# Patient Record
Sex: Female | Born: 1985 | Race: Black or African American | Hispanic: No | Marital: Single | Smoking: Never smoker
Health system: Southern US, Community
[De-identification: ages and names within clinical notes are randomized; demographics above are authoritative.]

## PROBLEM LIST (undated history)

## (undated) DIAGNOSIS — Z3009 Encounter for other general counseling and advice on contraception: Secondary | ICD-10-CM

## (undated) HISTORY — DX: Encounter for other general counseling and advice on contraception: Z30.09

## (undated) HISTORY — PX: BACK SURGERY: SHX140

---
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
## 2005-07-06 ENCOUNTER — Ambulatory Visit: Payer: Self-pay | Admitting: Family Medicine

## 2006-05-25 ENCOUNTER — Inpatient Hospital Stay (HOSPITAL_COMMUNITY): Admission: AD | Admit: 2006-05-25 | Discharge: 2006-05-27 | Payer: Self-pay | Admitting: Obstetrics & Gynecology

## 2006-05-25 ENCOUNTER — Ambulatory Visit: Payer: Self-pay | Admitting: Obstetrics and Gynecology

## 2006-12-28 ENCOUNTER — Other Ambulatory Visit: Admission: RE | Admit: 2006-12-28 | Discharge: 2006-12-28 | Payer: Self-pay | Admitting: Unknown Physician Specialty

## 2006-12-28 ENCOUNTER — Encounter (INDEPENDENT_AMBULATORY_CARE_PROVIDER_SITE_OTHER): Payer: Self-pay | Admitting: Unknown Physician Specialty

## 2008-01-19 ENCOUNTER — Ambulatory Visit: Payer: Self-pay | Admitting: Family Medicine

## 2008-01-23 ENCOUNTER — Encounter: Payer: Self-pay | Admitting: Family Medicine

## 2008-02-07 ENCOUNTER — Encounter: Payer: Self-pay | Admitting: Family Medicine

## 2008-04-19 ENCOUNTER — Encounter: Payer: Self-pay | Admitting: Family Medicine

## 2008-04-20 ENCOUNTER — Ambulatory Visit: Payer: Self-pay | Admitting: Family Medicine

## 2008-04-23 ENCOUNTER — Ambulatory Visit: Payer: Self-pay | Admitting: Family Medicine

## 2008-04-25 ENCOUNTER — Encounter: Payer: Self-pay | Admitting: Family Medicine

## 2008-04-25 LAB — CONVERTED CEMR LAB
Candida species: NEGATIVE
Chlamydia, DNA Probe: NEGATIVE
GC Probe Amp, Genital: NEGATIVE

## 2008-07-12 ENCOUNTER — Ambulatory Visit: Payer: Self-pay | Admitting: Family Medicine

## 2008-08-23 ENCOUNTER — Encounter: Payer: Self-pay | Admitting: Family Medicine

## 2008-08-23 ENCOUNTER — Ambulatory Visit: Payer: Self-pay | Admitting: Family Medicine

## 2008-08-23 ENCOUNTER — Other Ambulatory Visit: Admission: RE | Admit: 2008-08-23 | Discharge: 2008-08-23 | Payer: Self-pay | Admitting: Family Medicine

## 2008-08-24 ENCOUNTER — Encounter: Payer: Self-pay | Admitting: Family Medicine

## 2008-08-24 LAB — CONVERTED CEMR LAB
GC Probe Amp, Genital: NEGATIVE
Trichomonal Vaginitis: NEGATIVE

## 2008-09-06 ENCOUNTER — Other Ambulatory Visit: Admission: RE | Admit: 2008-09-06 | Discharge: 2008-09-06 | Payer: Self-pay | Admitting: Family Medicine

## 2008-09-06 ENCOUNTER — Ambulatory Visit: Payer: Self-pay | Admitting: Family Medicine

## 2008-09-06 ENCOUNTER — Encounter: Payer: Self-pay | Admitting: Family Medicine

## 2008-10-04 ENCOUNTER — Ambulatory Visit: Payer: Self-pay | Admitting: Family Medicine

## 2008-10-08 ENCOUNTER — Encounter: Payer: Self-pay | Admitting: Family Medicine

## 2008-10-08 LAB — CONVERTED CEMR LAB
BUN: 7 mg/dL (ref 6–23)
Chloride: 108 meq/L (ref 96–112)
Eosinophils Relative: 5 % (ref 0–5)
HCT: 35.2 % — ABNORMAL LOW (ref 36.0–46.0)
HDL: 49 mg/dL (ref >39)
Hemoglobin: 11.1 g/dL — ABNORMAL LOW (ref 12.0–15.0)
LDL Cholesterol: 61 mg/dL (ref 0–99)
Lymphocytes Relative: 37 % (ref 12–46)
Lymphs Abs: 2.2 10*3/uL (ref 0.7–4.0)
Monocytes Relative: 13 % — ABNORMAL HIGH (ref 3–12)
Platelets: 242 10*3/uL (ref 150–400)
Potassium: 4.6 meq/L (ref 3.5–5.3)
RBC: 4.95 M/uL (ref 3.87–5.11)
Triglycerides: 36 mg/dL (ref <150)
VLDL: 7 mg/dL (ref 0–40)
WBC: 5.8 10*3/uL (ref 4.0–10.5)

## 2008-10-11 ENCOUNTER — Encounter: Payer: Self-pay | Admitting: Family Medicine

## 2008-12-27 ENCOUNTER — Ambulatory Visit: Payer: Self-pay | Admitting: Family Medicine

## 2009-03-17 ENCOUNTER — Emergency Department (HOSPITAL_COMMUNITY): Admission: EM | Admit: 2009-03-17 | Discharge: 2009-03-17 | Payer: Self-pay | Admitting: Emergency Medicine

## 2009-03-21 ENCOUNTER — Ambulatory Visit: Payer: Self-pay | Admitting: Family Medicine

## 2009-05-30 ENCOUNTER — Ambulatory Visit: Payer: Self-pay | Admitting: Family Medicine

## 2009-05-30 LAB — CONVERTED CEMR LAB
Glucose, Urine, Semiquant: NEGATIVE
Protein, U semiquant: NEGATIVE
Urobilinogen, UA: 0.2

## 2009-06-01 ENCOUNTER — Encounter: Payer: Self-pay | Admitting: Physician Assistant

## 2009-06-03 ENCOUNTER — Telehealth: Payer: Self-pay | Admitting: Family Medicine

## 2009-10-17 ENCOUNTER — Ambulatory Visit: Payer: Self-pay | Admitting: Family Medicine

## 2009-10-17 ENCOUNTER — Other Ambulatory Visit: Admission: RE | Admit: 2009-10-17 | Discharge: 2009-10-17 | Payer: Self-pay | Admitting: Family Medicine

## 2009-10-17 LAB — CONVERTED CEMR LAB
Blood in Urine, dipstick: NEGATIVE
Chlamydia, DNA Probe: NEGATIVE
Ketones, urine, test strip: NEGATIVE
Nitrite: NEGATIVE
Urobilinogen, UA: 0.2

## 2009-10-18 ENCOUNTER — Encounter: Payer: Self-pay | Admitting: Family Medicine

## 2009-10-18 ENCOUNTER — Telehealth: Payer: Self-pay | Admitting: Family Medicine

## 2010-01-09 ENCOUNTER — Ambulatory Visit: Payer: Self-pay | Admitting: Family Medicine

## 2010-04-03 ENCOUNTER — Ambulatory Visit
Admission: RE | Admit: 2010-04-03 | Discharge: 2010-04-03 | Payer: Self-pay | Source: Home / Self Care | Attending: Family Medicine | Admitting: Family Medicine

## 2010-04-10 NOTE — Assessment & Plan Note (Signed)
Summary: depo  Nurse Visit   Vital Signs:  Patient profile:   25 year old female Weight:      132.75 pounds BP sitting:   120 / 60  (left arm)  Vitals Entered By: Adella Hare LPN (January 09, 2010 11:26 AM)  Allergies: No Known Drug Allergies  Medication Administration  Injection # 1:    Medication: Depo-Provera 150mg     Diagnosis: CONTRACEPTIVE MANAGEMENT (ICD-V25.09)    Route: IM    Site: L deltoid    Exp Date: 06/14    Lot #: Sanda Linger    Mfr: [email or web address]    Patient tolerated injection without complications    Given by: Adella Hare LPN (January 09, 2010 11:27 AM)  Orders Added: 1)  Depo-Provera 150mg  [J1055] 2)  Admin of Therapeutic Inj  intramuscular or subcutaneous [96372]   Medication Administration  Injection # 1:    Medication: Depo-Provera 150mg     Diagnosis: CONTRACEPTIVE MANAGEMENT (ICD-V25.09)    Route: IM    Site: L deltoid    Exp Date: 06/14    Lot #: Sanda Linger    Mfr: [email or web address]    Patient tolerated injection without complications    Given by: Adella Hare LPN (January 09, 2010 11:27 AM)  Orders Added: 1)  Depo-Provera 150mg  [J1055] 2)  Admin of Therapeutic Inj  intramuscular or subcutaneous [96372] injections given with no complications

## 2010-04-10 NOTE — Assessment & Plan Note (Signed)
Summary: depo/slj  Nurse Visit   Vital Signs:  Patient profile:   25 year old female Weight:      132.25 pounds BP sitting:   124 / 64  (left arm)  Allergies: No Known Drug Allergies  Medication Administration  Injection # 1:    Medication: Depo-Provera 150mg     Diagnosis: CONTRACEPTIVE MANAGEMENT (ICD-V25.09)    Route: IM    Site: L deltoid    Exp Date: 01/14    Lot #: Haywood Pao    Mfr: [email or web address]    Patient tolerated injection without complications    Given by: Adella Hare LPN (April 03, 2010 10:16 AM)  Orders Added: 1)  Depo-Provera 150mg  [J1055] 2)  Admin of Therapeutic Inj  intramuscular or subcutaneous [96372]   Medication Administration  Injection # 1:    Medication: Depo-Provera 150mg     Diagnosis: CONTRACEPTIVE MANAGEMENT (ICD-V25.09)    Route: IM    Site: L deltoid    Exp Date: 01/14    Lot #: Haywood Pao    Mfr: [email or web address]    Patient tolerated injection without complications    Given by: Adella Hare LPN (April 03, 2010 10:16 AM)  Orders Added: 1)  Depo-Provera 150mg  [J1055] 2)  Admin of Therapeutic Inj  intramuscular or subcutaneous [16109]

## 2010-04-10 NOTE — Assessment & Plan Note (Signed)
Summary: INJ  Nurse Visit   Vital Signs:  Patient profile:   25 year old female Height:      65 inches Weight:      130 pounds Pulse rate:   106 / minute BP sitting:   110 / 70  (left arm)  Vitals Entered By: Worthy Keeler LPN (March 21, 2009 9:57 AM) Comments patient in for depo provera inj.   Allergies: No Known Drug Allergies  Medication Administration  Injection # 1:    Medication: Depo-Provera 150mg     Diagnosis: CONTRACEPTIVE MANAGEMENT (ICD-V25.09)    Route: IM    Site: L deltoid    Exp Date: 8/13    Lot #: [id number]    Mfr: greenstone ltd    Patient tolerated injection without complications    Given by: Worthy Keeler LPN (March 21, 2009 9:58 AM)  Orders Added: 1)  Admin of Therapeutic Inj  intramuscular or subcutaneous [96372]   Medication Administration  Injection # 1:    Medication: Depo-Provera 150mg     Diagnosis: CONTRACEPTIVE MANAGEMENT (ICD-V25.09)    Route: IM    Site: L deltoid    Exp Date: 8/13    Lot #: [id number]    Mfr: greenstone ltd    Patient tolerated injection without complications    Given by: Worthy Keeler LPN (March 21, 2009 9:58 AM)  Orders Added: 1)  Admin of Therapeutic Inj  intramuscular or subcutaneous [[id number]]

## 2010-04-10 NOTE — Progress Notes (Signed)
Summary: please advise  Phone Note Call from Patient   Summary of Call: [phone] patient would like for you to call her back, she asked to speak to the doctor she wouldn't tell me what it was about or anything.   Initial call taken by: Curtis Sites,  June 03, 2009 2:00 PM  Follow-up for Phone Call        Patient had been hurting yesterday but only when she wiped. She states shes fine now. No problems with anything. Advised her if she started hurting again to call us back  Follow-up by: Everitt Amber LPN,  June 04, 2009 3:04 PM

## 2010-04-10 NOTE — Assessment & Plan Note (Signed)
Summary: ? UTI - room 2   Vital Signs:  Patient profile:   25 year old female Height:      65 inches Weight:      131.50 pounds BMI:     21.96 O2 Sat:      100 % on Room air Pulse rate:   82 / minute Resp:     16 per minute BP sitting:   132 / 76  (left arm)  Vitals Entered By: Adella Hare LPN (May 30, 2009 3:14 PM) CC: burning with urination Is Patient Diabetic? No Pain Assessment Patient in pain? no        CC:  burning with urination.  History of Present Illness: Pt here today with c/o dysuria x 4 days.   Carnberry juice some help. + freq + abd pain, + nausea no back pain  Also interested in changing birth control.  Has been on Depo provera x 3 ys.   Used OCP's in past & no probs with.  Current Medications (verified): 1)  None  Allergies (verified): No Known Drug Allergies  Past History:  Past medical history reviewed for relevance to current acute and chronic problems.  Past Medical History: Reviewed history from 01/23/2008 and no changes required. 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 CV:  Denies chest pain or discomfort. Resp:  Denies shortness of breath. GI:  Complains of abdominal pain and nausea; denies vomiting. GU:  Denies dysuria and urinary frequency. MS:  Denies low back pain.  Physical Exam  General:  Well-developed,well-nourished,in no acute distress; alert,appropriate and cooperative throughout examination Head:  Normocephalic and atraumatic without obvious abnormalities. No apparent alopecia or balding. Neck:  No deformities, masses, or tenderness noted. Lungs:  Normal respiratory effort, chest expands symmetrically. Lungs are clear to auscultation, no crackles or wheezes. Heart:  Normal rate and regular rhythm. S1 and S2 normal without gallop, murmur, click, rub or other extra sounds. Abdomen:   Bowel sounds positive,abdomen soft and non-tender without masses, organomegaly or hernias noted. Msk:  Neg [employer] Psych:  Cognition and judgment appear intact. Alert and cooperative with normal attention span and concentration. No apparent delusions, illusions, hallucinations   Impression & Recommendations:  Problem # 1:  UTI (ICD-599.0) Assessment New  The following medications were removed from the medication list:    Flagyl 500 Mg Tabs (Metronidazole) .Marland Kitchen... Take 1 tablet by mouth two times a day Her updated medication list for this problem includes:    Bactrim Ds 800-160 Mg Tabs (Sulfamethoxazole-trimethoprim) .Marland Kitchen... Take 1 two times a day x 3 days  Problem # 2:  CONTRACEPTIVE MANAGEMENT (ICD-V25.09) Pt will start Ortho Tricyclen Lo Sun Apr 2.  This is near the time she is due for her next Depo Provera injection.  Complete Medication List: 1)  Bactrim Ds 800-160 Mg Tabs (Sulfamethoxazole-trimethoprim) .... Take 1 two times a day x 3 days 2)  Ortho Tri-cyclen Lo 0.18/0.215/0.25 Mg-25 Mcg Tabs (Norgestim-eth estrad triphasic) .... Take 1 daily  Other Orders: UA Dipstick W/ Micro (manual) (19147) T-Culture, Urine ([id number])  Patient Instructions: 1)  Physical/Pap due Aug 2011. 2)  Increase fluids 3)  I have prescribed an antibiotic for your urinary infection. 4)  Start your birth control pills on Sunday April 2.  Take it about the same time every day. Prescriptions: ORTHO TRI-CYCLEN LO 0.18/0.215/0.25 MG-25 MCG TABS (NORGESTIM-ETH ESTRAD TRIPHASIC) take 1 daily  #1 x 5  Entered and Authorized by:   Esperanza Sheets PA   Signed by:   Esperanza Sheets PA on 05/30/2009   Method used:   Electronically to        [facility] [address] [phone]* (retail)       [address] [place], [place]  [address]       Ph: [phone]       Fax: [phone]   RxID:   [id number] BACTRIM DS 800-160 MG TABS (SULFAMETHOXAZOLE-TRIMETHOPRIM) take 1 two times a day x 3 days  #6 x 0   Entered and  Authorized by:   Esperanza Sheets PA   Signed by:   Esperanza Sheets PA on 05/30/2009   Method used:   Electronically to        [facility] [address] [phone]* (retail)       [address] [place], [place]  [address]       Ph: [phone]       Fax: [phone]   RxID:   [id number]   Laboratory Results   Urine Tests  Date/Time Received: May 30, 2009  Date/Time Reported: May 30, 2009   Routine Urinalysis   Color: yellow Appearance: Clear Glucose: negative   (Normal Range: Negative) Bilirubin: negative   (Normal Range: Negative) Ketone: negative   (Normal Range: Negative) Spec. Gravity: 1.015   (Normal Range: 1.003-1.035) Blood: trace-lysed   (Normal Range: Negative) pH: 7.5   (Normal Range: 5.0-8.0) Protein: negative   (Normal Range: Negative) Urobilinogen: 0.2   (Normal Range: 0-1) Nitrite: negative   (Normal Range: Negative) Leukocyte Esterace: small   (Normal Range: Negative)

## 2010-04-10 NOTE — Progress Notes (Signed)
Summary: CALL  Phone Note Call from Patient   Summary of Call: CALL HER AT [phone] Initial call taken by: Lind Guest,  October 18, 2009 8:28 AM  Follow-up for Phone Call        Already spoke to patient. See previous message  Follow-up by: Everitt Amber LPN,  October 18, 2009 11:13 AM

## 2010-04-10 NOTE — Assessment & Plan Note (Signed)
Summary: physical   Vital Signs:  Patient profile:   25 year old female Height:      65 inches Weight:      133.25 pounds BMI:     22.25 O2 Sat:      100 % on Room air Pulse rate:   89 / minute Pulse rhythm:   regular Resp:     16 per minute BP sitting:   100 / 70  (left arm)  Vitals Entered By: Adella Hare LPN (October 17, 2009 10:33 AM)  O2 Flow:  Room air CC: physical Is Patient Diabetic? No Pain Assessment Patient in pain? no       Vision Screening:Left eye w/o correction: 20 / 20 Right Eye w/o correction: 20 / 20 Both eyes w/o correction:  20/ 20  Color vision testing: normal      Vision Entered By: Adella Hare LPN (October 17, 2009 10:41 AM)   CC:  physical.  History of Present Illness: Reports  that she has been  doing well. Denies recent fever or chills. Denies sinus pressure, nasal congestion , ear pain or sore throat. Denies chest congestion, or cough productive of sputum. Denies chest pain, palpitations, PND, orthopnea or leg swelling. Denies abdominal pain, nausea, vomitting, diarrhea or constipation. Denies change in bowel movements or bloody stool. Denies dysuria , frequency, incontinence or hesitancy. Denies  joint pain, swelling, or reduced mobility. Denies headaches, vertigo, seizures. Denies depression, anxiety or insomnia. Denies  rash, lesions, or itch.     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 Eyes:  Denies blurring and double  vision. Endo:  Denies excessive thirst and excessive urination. Heme:  Denies abnormal bruising and bleeding. Allergy:  Denies hives or rash and itching eyes.  Physical Exam  General:  Well-developed,well-nourished,in no acute distress; alert,appropriate and cooperative throughout examination Head:  Normocephalic and atraumatic without obvious abnormalities. No apparent alopecia or balding. Eyes:  No corneal or conjunctival inflammation noted. EOMI. Perrla. Funduscopic exam benign, without hemorrhages, exudates or papilledema. Vision grossly normal. Ears:  External ear exam shows no significant lesions or deformities.  Otoscopic examination reveals clear canals, tympanic membranes are intact bilaterally without bulging, retraction, inflammation or discharge. Hearing is grossly normal bilaterally. Nose:  External nasal examination shows no deformity or inflammation. Nasal mucosa are pink and moist without lesions or exudates. Mouth:  Oral mucosa and oropharynx without lesions or exudates.  Teeth in good repair. Neck:  No deformities, masses, or tenderness noted. Chest Wall:  No deformities, masses, or tenderness noted. Breasts:  No mass, nodules, thickening, tenderness, bulging, retraction, inflamation, nipple discharge or skin changes noted.   Lungs:  Normal respiratory effort, chest expands symmetrically. Lungs are clear to auscultation, no crackles or wheezes. Heart:  Normal rate and regular rhythm. S1 and S2 normal without gallop, murmur, click, rub or other extra sounds. Abdomen:  Bowel sounds positive,abdomen soft and non-tender without masses, organomegaly or hernias noted. Genitalia:  Normal introitus for age, no external lesions,white vaginal discharge, mucosa pink and moist, no vaginal or cervical lesions, no  vaginal atrophy, no friaility or hemorrhage, normal uterus size and position, no adnexal masses or tendernessos stenotic , pt has had procedure for abn pap Msk:  No deformity or  scoliosis noted of thoracic or lumbar spine.   Pulses:  R and L carotid,radial,femoral,dorsalis pedis and posterior tibial pulses are full and equal bilaterally Extremities:  No clubbing, cyanosis, edema, or deformity noted with normal full range of motion of all joints.   Neurologic:  No cranial nerve deficits noted. Station and gait are normal. Plantar reflexes are down-going bilaterally. DTRs are symmetrical throughout. Sensory, motor and coordinative functions appear intact. Skin:  Intact without suspicious lesions or rashes Cervical Nodes:  No lymphadenopathy noted Axillary Nodes:  No palpable lymphadenopathy Inguinal Nodes:  No significant adenopathy Psych:  Cognition and judgment appear intact. Alert and cooperative with normal attention span and concentration. No apparent delusions, illusions, hallucinations   Complete Medication List: 1)  Metronidazole 500 Mg Tabs (Metronidazole) .... Take 1 tablet by mouth two times a day  Other Orders: Urine Pregnancy Test  (45409) Urinalysis (81191-47829) T-Culture, Urine ([id number]) T-CBC w/Diff (84696-29528) T-Anemia Panel 3  (2904) Pap Smear (41324) T-Wet Prep by Molecular Probe [id number]) T-Chlamydia & GC Probe, Genital ([id number]) Depo-Provera 150mg  (J1055) Admin of Therapeutic Inj  intramuscular or subcutaneous (64403)  Patient Instructions: 1)  F/u in 12 weeks exact for depo. 2)  It is important that you exercise regularly at least 20 minutes 5 times a week. If you develop chest pain, have severe difficulty breathing, or feel very tired , stop exercising immediately and seek medical attention. 3)  If you could be exposed to sexually transmitted diseases, you should use a condom. 4)  If you are having sex and you or your partner don't want a child, use contraception. 5)  CBC w/ Diff prior to visit, ICD-9: and anemia panel. 6)  You need to take one multivitamin daily, 7)  You als need to take calcium with vit D  1200mg /1000IU one daily 8)  We will cakllou next week with results Prescriptions: METRONIDAZOLE 500 MG TABS (METRONIDAZOLE) Take 1 tablet by mouth two times a day  #14 x 0   Entered and Authorized by:   Syliva Overman MD   Signed by:   Syliva Overman MD on 10/18/2009   Method used:   Historical   RxID:   [id number]   Laboratory Results   Urine Tests  Date/Time Received: October 17, 2009 10:34 AM  Date/Time Reported: October 17, 2009 10:34 AM   Routine Urinalysis   Color: yellow Appearance: Clear Glucose: negative   (Normal Range: Negative) Bilirubin: negative   (Normal Range: Negative) Ketone: negative   (Normal Range: Negative) Spec. Gravity: 1.025   (Normal Range: 1.003-1.035) Blood: negative   (Normal Range: Negative) pH: 7.0   (Normal Range: 5.0-8.0) Protein: negative   (Normal Range: Negative) Urobilinogen: 0.2   (Normal Range: 0-1) Nitrite: negative   (Normal Range: Negative) Leukocyte Esterace: trace   (Normal Range: Negative)    Urine HCG: negative       Medication Administration  Injection # 1:    Medication: Depo-Provera 150mg     Diagnosis: CONTRACEPTIVE MANAGEMENT (ICD-V25.09)    Route: IM    Site: R deltoid    Exp Date: 1/14    Lot #: Haywood Pao    Mfr: greenstone    Patient tolerated injection without complications    Given by: Adella Hare LPN (October 17, 2009 11:15 AM)  Orders Added: 1)  Urine Pregnancy Test  [81025]  2)  Urinalysis [81003-65000] 3)  T-Culture, Urine [[id number]] 4)  Est. Patient 18-39 years [99395] 5)  T-CBC w/Diff [82956-21308] 6)  T-Anemia Panel 3  [2904] 7)  Pap Smear [88150] 8)  T-Wet Prep by Molecular Probe [[id number]] 9)  T-Chlamydia & GC Probe, Genital [[id number]] 10)  Depo-Provera 150mg  [J1055] 11)  Admin of Therapeutic Inj  intramuscular or subcutaneous [52841]

## 2010-06-26 ENCOUNTER — Encounter: Payer: Self-pay | Admitting: Family Medicine

## 2010-06-26 ENCOUNTER — Ambulatory Visit (INDEPENDENT_AMBULATORY_CARE_PROVIDER_SITE_OTHER): Payer: PRIVATE HEALTH INSURANCE | Admitting: Family Medicine

## 2010-06-26 VITALS — BP 110/70 | Wt 131.4 lb

## 2010-06-26 DIAGNOSIS — Z309 Encounter for contraceptive management, unspecified: Secondary | ICD-10-CM

## 2010-06-26 MED ORDER — MEDROXYPROGESTERONE ACETATE 150 MG/ML IM SUSP
150.0000 mg | Freq: Once | INTRAMUSCULAR | Status: AC
  Administered 2010-06-26: 150 mg via INTRAMUSCULAR

## 2010-06-26 NOTE — Progress Notes (Signed)
Depo provera 150mg  given in left deltoid. Told to come back 1st week in July

## 2010-07-25 NOTE — H&P (Signed)
NAMEWAUNITA, SANDSTROM NO.:  [id number]   MEDICAL RECORD NO.:  [id number]          PATIENT TYPE:  INP   LOCATION:  9112                          FACILITY:  [facility]   PHYSICIAN:  Tilda Burrow, M.D. DATE OF BIRTH:  07-Jan-1986   DATE OF ADMISSION:  05/25/2006  DATE OF DISCHARGE:  LH                              HISTORY & PHYSICAL   ADMISSION DIAGNOSES:  1. Pregnancy at 39 weeks.  2. Advanced cervical favorability.  3. Elective induction of labor.   HISTORY OF PRESENT ILLNESS:  This 25 year old, primiparous female who  lives in [place], [place] August 27, 2005, placing [facility] June 03, 2006 with  two corresponding ultrasounds is admitted after prenatal course was  followed at [facility] through 12 prenatal visits with an  uncomplicated prenatal course.  She is admitted for induction of labor  with advanced cervical favorability.   PAST MEDICAL HISTORY:  Uneventful history other than scoliosis for which  she had Harrington rods placed in the back in 2005 at [facility].  Results are unavailable but the mid line incision  runs down to L2-3, so plans are to avoid consideration of epidural.   ALLERGIES:  Negative.   SOCIAL HISTORY:  Single. Lives with mom.  Works at [employer].  Partner  is _____ [facility] who is supportive.  This is his second child.  She plans  IV analgesic and take baby to Dr. Milford Cage.  She plans __________ future  contraception and will bottle feed.   PRENATAL LABS:  Blood type O positive, rubella immune.  Urine drug  screen negative.  Hemoglobin 11, hematocrit 34. Hepatitis, HIV, RPR, GC  and Chlamydia all negative.  GBS negative.  Glucose tolerance test 97  mg/%.   PHYSICAL EXAMINATION:  GENERAL:  [place], [place] American female.  VITAL SIGNS:  Height 5 feet 5 inches, weight 156.  Blood pressure  122/68.  GENERAL:  Alert and oriented x3.  HEENT:  Pupils are equal, round and reactive.  NECK:  Supple. Trachea midline.  ABDOMEN:  35 to 36 cm fundal height.  Vertex presentation.  PELVIC:  Cervix 3, 75%, minus 1, position cervix vertex.   PLAN:  Pitocin induction on May 27, 2006.      Tilda Burrow, M.D.  Electronically Signed     JVF/MEDQ  D:  05/25/2006  T:  05/26/2006  Job:  [id number]   cc:   Francoise Schaumann. Milford Cage DO, FAAP  Fax: [phone]

## 2010-09-09 ENCOUNTER — Ambulatory Visit (INDEPENDENT_AMBULATORY_CARE_PROVIDER_SITE_OTHER): Payer: PRIVATE HEALTH INSURANCE | Admitting: Family Medicine

## 2010-09-09 VITALS — BP 110/70 | Wt 133.1 lb

## 2010-09-09 DIAGNOSIS — Z309 Encounter for contraceptive management, unspecified: Secondary | ICD-10-CM

## 2010-09-09 MED ORDER — MEDROXYPROGESTERONE ACETATE 150 MG/ML IM SUSP
150.0000 mg | Freq: Once | INTRAMUSCULAR | Status: AC
  Administered 2010-09-09: 150 mg via INTRAMUSCULAR

## 2010-11-18 ENCOUNTER — Encounter: Payer: PRIVATE HEALTH INSURANCE | Admitting: Family Medicine

## 2010-12-01 ENCOUNTER — Encounter: Payer: Self-pay | Admitting: Family Medicine

## 2010-12-02 ENCOUNTER — Other Ambulatory Visit (HOSPITAL_COMMUNITY)
Admission: RE | Admit: 2010-12-02 | Discharge: 2010-12-02 | Disposition: A | Discharge status: Home / Self Care | Payer: 59 | Source: Ambulatory Visit | Attending: Family Medicine | Admitting: Family Medicine

## 2010-12-02 ENCOUNTER — Ambulatory Visit: Payer: PRIVATE HEALTH INSURANCE

## 2010-12-02 ENCOUNTER — Ambulatory Visit (INDEPENDENT_AMBULATORY_CARE_PROVIDER_SITE_OTHER): Payer: PRIVATE HEALTH INSURANCE | Admitting: Family Medicine

## 2010-12-02 ENCOUNTER — Encounter: Payer: Self-pay | Admitting: Family Medicine

## 2010-12-02 VITALS — BP 104/60 | HR 86 | Resp 16 | Ht 65.0 in | Wt 135.0 lb

## 2010-12-02 DIAGNOSIS — Z309 Encounter for contraceptive management, unspecified: Secondary | ICD-10-CM

## 2010-12-02 DIAGNOSIS — N760 Acute vaginitis: Secondary | ICD-10-CM

## 2010-12-02 DIAGNOSIS — Z0000 Encounter for general adult medical examination without abnormal findings: Secondary | ICD-10-CM

## 2010-12-02 DIAGNOSIS — Z124 Encounter for screening for malignant neoplasm of cervix: Secondary | ICD-10-CM

## 2010-12-02 DIAGNOSIS — Z01419 Encounter for gynecological examination (general) (routine) without abnormal findings: Secondary | ICD-10-CM | POA: Insufficient documentation

## 2010-12-02 DIAGNOSIS — Z23 Encounter for immunization: Secondary | ICD-10-CM

## 2010-12-02 MED ORDER — INFLUENZA VAC TYPES A & B PF IM SUSP: 0.5000 mL | Freq: Once | INTRAMUSCULAR | Status: DC

## 2010-12-02 MED ORDER — MEDROXYPROGESTERONE ACETATE 150 MG/ML IM SUSP
150.0000 mg | Freq: Once | INTRAMUSCULAR | Status: AC
  Administered 2010-12-02: 150 mg via INTRAMUSCULAR

## 2010-12-02 NOTE — Patient Instructions (Addendum)
CPE in 12 months.  Depo provera every 12 weeks   TdAp and flu vaccine today.  Fasting labs past due pls get asap  It is important that you exercise regularly at least 30 minutes 5 times a week. If you develop chest pain, have severe difficulty breathing, or feel very tired, stop exercising immediately and seek medical attention  A healthy diet is rich in fruit, vegetables and whole grains. Poultry fish, nuts and beans are a healthy choice for protein rather then red meat. A low sodium diet and drinking 64 ounces of water daily is generally recommended. Oils and sweet should be limited. Carbohydrates especially for those who are diabetic or overweight, should be limited to 34-45 gram per meal. It is important to eat on a regular schedule, at least 3 times daily. Snacks should be primarily fruits, vegetables or nuts.   Marland Kitchen

## 2010-12-04 LAB — WET PREP BY MOLECULAR PROBE
Candida species: NEGATIVE
Trichomonas vaginosis: NEGATIVE

## 2010-12-04 LAB — GC/CHLAMYDIA PROBE AMP, GENITAL: GC Probe Amp, Genital: NEGATIVE

## 2010-12-06 ENCOUNTER — Encounter: Payer: Self-pay | Admitting: *Deleted

## 2010-12-06 ENCOUNTER — Telehealth: Payer: Self-pay | Admitting: *Deleted

## 2010-12-06 DIAGNOSIS — Z0000 Encounter for general adult medical examination without abnormal findings: Secondary | ICD-10-CM | POA: Insufficient documentation

## 2010-12-06 NOTE — Assessment & Plan Note (Signed)
Exam within normal. Counseled pt re the importance of regular exercise, healthy diet, use of seat belts and safe sexual practices. She is to continue to use depo provera. Immunization is updated

## 2010-12-06 NOTE — Progress Notes (Signed)
  Subjective:    Patient ID: Elizabeth Gardner, female    DOB: 1985-08-28, 25 y.o.   MRN: [id number]  HPI The PT is here for annual exam.  Preventive health is updated, specifically  Cancer screening and Immunization.   There are no new concerns.  There are no specific complaints       Review of Systems Denies recent fever or chills. Denies sinus pressure, nasal congestion, ear pain or sore throat. Denies chest congestion, productive cough or wheezing. Denies chest pains, palpitations and leg swelling Denies abdominal pain, nausea, vomiting,diarrhea or constipation.   Denies dysuria, frequency, hesitancy or incontinence. Denies joint pain, swelling and limitation in mobility. Denies headaches, seizures, numbness, or tingling. Denies depression, anxiety or insomnia. Denies skin break down or rash.        Objective:   Physical Exam Pleasant well nourished female, alert and oriented x 3, in no cardio-pulmonary distress. Afebrile. HEENT No facial trauma or asymetry. Sinuses non tender.  EOMI, PERTL, fundoscopic exam is normal, no hemorhage or exudate.  External ears normal, tympanic membranes clear. Oropharynx moist, no exudate, good dentition. Neck: supple, no adenopathy,JVD or thyromegaly.No bruits.  Chest: Clear to ascultation bilaterally.No crackles or wheezes. Non tender to palpation  Breast: No asymetry,no masses. No nipple discharge or inversion. No axillary or supraclavicular adenopathy  Cardiovascular system; Heart sounds normal,  S1 and  S2 ,no S3.  No murmur, or thrill. Apical beat not displaced Peripheral pulses normal.  Abdomen: Soft, non tender, no organomegaly or masses. No bruits. Bowel sounds normal. No guarding, tenderness or rebound.   GU: External genitalia normal. No lesions. Vaginal canal normal.No discharge. Uterus normal size, no adnexal masses, no cervical motion or adnexal tenderness.  Musculoskeletal exam: Full ROM of spine, hips ,  shoulders and knees. No deformity ,swelling or crepitus noted. No muscle wasting or atrophy.   Neurologic: Cranial nerves 2 to 12 intact. Power, tone ,sensation and reflexes normal throughout. No disturbance in gait. No tremor.  Skin: Intact, no ulceration, erythema , scaling or rash noted. Pigmentation normal throughout  Psych; Normal mood and affect. Judgement and concentration normal        Assessment & Plan:

## 2010-12-06 NOTE — Telephone Encounter (Signed)
Mailed normal pap letter 

## 2010-12-09 LAB — LIPID PANEL
HDL: 42 mg/dL (ref >39)
LDL Cholesterol: 79 mg/dL (ref 0–99)
Total CHOL/HDL Ratio: 3 Ratio

## 2010-12-09 LAB — BASIC METABOLIC PANEL
CO2: 21 mEq/L (ref 19–32)
Calcium: 9.4 mg/dL (ref 8.4–10.5)
Chloride: 106 mEq/L (ref 96–112)
Creat: 0.66 mg/dL (ref 0.50–1.10)
Glucose, Bld: 83 mg/dL (ref 70–99)
Sodium: 139 mEq/L (ref 135–145)

## 2010-12-10 LAB — CBC WITH DIFFERENTIAL/PLATELET
Eosinophils Absolute: 0.2 10*3/uL (ref 0.0–0.7)
Eosinophils Relative: 4 % (ref 0–5)
HCT: 34.9 % — ABNORMAL LOW (ref 36.0–46.0)
Hemoglobin: 10.9 g/dL — ABNORMAL LOW (ref 12.0–15.0)
Lymphocytes Relative: 43 % (ref 12–46)
Lymphs Abs: 2.7 10*3/uL (ref 0.7–4.0)
MCH: 22.2 pg — ABNORMAL LOW (ref 26.0–34.0)
MCV: 71.1 fL — ABNORMAL LOW (ref 78.0–100.0)
Monocytes Relative: 8 % (ref 3–12)
RBC: 4.91 MIL/uL (ref 3.87–5.11)
WBC: 6.3 10*3/uL (ref 4.0–10.5)

## 2010-12-15 NOTE — Progress Notes (Signed)
Patient aware.

## 2011-02-24 ENCOUNTER — Ambulatory Visit (INDEPENDENT_AMBULATORY_CARE_PROVIDER_SITE_OTHER): Payer: Medicaid Other

## 2011-02-24 VITALS — BP 116/70 | Wt 135.1 lb

## 2011-02-24 DIAGNOSIS — Z309 Encounter for contraceptive management, unspecified: Secondary | ICD-10-CM

## 2011-02-24 MED ORDER — MEDROXYPROGESTERONE ACETATE 150 MG/ML IM SUSP
150.0000 mg | Freq: Once | INTRAMUSCULAR | Status: AC
  Administered 2011-02-24: 150 mg via INTRAMUSCULAR

## 2011-02-24 NOTE — Progress Notes (Signed)
  Subjective:    Patient ID: Elizabeth Gardner, female    DOB: Apr 07, 1985, 25 y.o.   MRN: [id number]  HPI    Review of Systems     Objective:   Physical Exam        Assessment & Plan:  Pt in for depo shot.  Given and in left arm with no complications.

## 2011-05-14 ENCOUNTER — Ambulatory Visit (INDEPENDENT_AMBULATORY_CARE_PROVIDER_SITE_OTHER): Payer: Medicaid Other

## 2011-05-14 VITALS — BP 100/60 | Wt 137.0 lb

## 2011-05-14 DIAGNOSIS — Z3049 Encounter for surveillance of other contraceptives: Secondary | ICD-10-CM

## 2011-05-14 DIAGNOSIS — Z309 Encounter for contraceptive management, unspecified: Secondary | ICD-10-CM

## 2011-05-14 MED ORDER — MEDROXYPROGESTERONE ACETATE 150 MG/ML IM SUSP
150.0000 mg | Freq: Once | INTRAMUSCULAR | Status: AC
  Administered 2011-05-14: 150 mg via INTRAMUSCULAR

## 2011-05-14 NOTE — Progress Notes (Signed)
Pt received injection in left deltoid with no complications

## 2011-05-15 ENCOUNTER — Ambulatory Visit: Payer: Medicaid Other

## 2011-05-27 ENCOUNTER — Encounter (HOSPITAL_COMMUNITY): Payer: Self-pay

## 2011-05-27 ENCOUNTER — Emergency Department (HOSPITAL_COMMUNITY)
Admission: EM | Admit: 2011-05-27 | Discharge: 2011-05-27 | Disposition: A | Discharge status: Home / Self Care | Payer: BC Managed Care – PPO | Attending: Emergency Medicine | Admitting: Emergency Medicine

## 2011-05-27 DIAGNOSIS — R05 Cough: Secondary | ICD-10-CM | POA: Insufficient documentation

## 2011-05-27 DIAGNOSIS — R509 Fever, unspecified: Secondary | ICD-10-CM | POA: Insufficient documentation

## 2011-05-27 DIAGNOSIS — J3489 Other specified disorders of nose and nasal sinuses: Secondary | ICD-10-CM | POA: Insufficient documentation

## 2011-05-27 DIAGNOSIS — R059 Cough, unspecified: Secondary | ICD-10-CM | POA: Insufficient documentation

## 2011-05-27 DIAGNOSIS — J029 Acute pharyngitis, unspecified: Secondary | ICD-10-CM

## 2011-05-27 DIAGNOSIS — R5381 Other malaise: Secondary | ICD-10-CM | POA: Insufficient documentation

## 2011-05-27 MED ORDER — LIDOCAINE VISCOUS 2 % MT SOLN
10.0000 mL | Freq: Once | OROMUCOSAL | Status: AC
  Administered 2011-05-27: 10 mL via OROMUCOSAL
  Filled 2011-05-27: qty 15

## 2011-05-27 MED ORDER — OXYMETAZOLINE HCL 0.05 % NA SOLN
1.0000 | Freq: Once | NASAL | Status: AC
  Administered 2011-05-27: 2 via NASAL
  Filled 2011-05-27: qty 15

## 2011-05-27 NOTE — ED Notes (Signed)
Nosebleed subsided at this time.

## 2011-05-27 NOTE — Discharge Instructions (Signed)
Pharyngitis, Viral and Bacterial Pharyngitis is soreness (inflammation) or infection of the pharynx. It is also called a sore throat. CAUSES  Most sore throats are caused by viruses and are part of a cold. However, some sore throats are caused by strep and other bacteria. Sore throats can also be caused by post nasal drip from draining sinuses, allergies and sometimes from sleeping with an open mouth. Infectious sore throats can be spread from person to person by coughing, sneezing and sharing cups or eating utensils. TREATMENT  Sore throats that are viral usually last 3-4 days. Viral illness will get better without medications (antibiotics). Strep throat and other bacterial infections will usually begin to get better about 24-48 hours after you begin to take antibiotics. HOME CARE INSTRUCTIONS   If the caregiver feels there is a bacterial infection or if there is a positive strep test, they will prescribe an antibiotic. The full course of antibiotics must be taken. If the full course of antibiotic is not taken, you or your child may become ill again. If you or your child has strep throat and do not finish all of the medication, serious heart or kidney diseases may develop.   Drink enough water and fluids to keep your urine clear or pale yellow.   Only take over-the-counter or prescription medicines for pain, discomfort or fever as directed by your caregiver.   Get lots of rest.   Gargle with salt water ( tsp. of salt in a glass of water) as often as every 1-2 hours as you need for comfort.   Hard candies may soothe the throat if individual is not at risk for choking. Throat sprays or lozenges may also be used.  SEEK MEDICAL CARE IF:   Large, tender lumps in the neck develop.   A rash develops.   Green, yellow-brown or bloody sputum is coughed up.   Your baby is older than 3 months with a rectal temperature of 100.5 F (38.1 C) or higher for more than 1 day.  SEEK IMMEDIATE MEDICAL CARE  IF:   A stiff neck develops.   You or your child are drooling or unable to swallow liquids.   You or your child are vomiting, unable to keep medications or liquids down.   You or your child has severe pain, unrelieved with recommended medications.   You or your child are having difficulty breathing (not due to stuffy nose).   You or your child are unable to fully open your mouth.   You or your child develop redness, swelling, or severe pain anywhere on the neck.   You have a fever.   Your baby is older than 3 months with a rectal temperature of 102 F (38.9 C) or higher.   Your baby is 41 months old or younger with a rectal temperature of 100.4 F (38 C) or higher.  MAKE SURE YOU:   Understand these instructions.   Will watch your condition.   Will get help right away if you are not doing well or get worse.  Document Released: 02/23/2005 Document Revised: 02/12/2011 Document Reviewed: 05/23/2007 [facility] Patient Information 2012 [place], [place].    your strep test is negative tonight, therefore your symptoms are most likely related to a viral infection which should improve with time.  Use a ibuprofen (Motrin or Advil ) for pain relief..  Rest and drink plenty of fluids.  I encourage you to continue taking Mucinex which will also help with your nasal drainage.  Avoid blowing her  nose vigorously, use the Afrin given 1-2 puffs if she developed a new nosebleed.  Gently hold pressure until the bleeding has resolved, return here if you have any persistent symptoms.

## 2011-05-27 NOTE — ED Notes (Signed)
Patient bleeding from right nare.  J Idol, PA aware and order received for Afrin nasal spray.

## 2011-05-27 NOTE — ED Notes (Signed)
Cough congestion and sore throat since Sunday. nad noted.

## 2011-05-29 NOTE — ED Provider Notes (Signed)
History     CSN: [id number]  Arrival date & time 05/27/11  2025   First MD Initiated Contact with Patient 05/27/11 2049      Chief Complaint  Patient presents with  . Cough  . Sore Throat  . Nasal Congestion    (Consider location/radiation/quality/duration/timing/severity/associated sxs/prior treatment) Patient is a 26 y.o. female presenting with pharyngitis. The history is provided by the patient.  Sore Throat This is a new problem. Episode onset: 3 days ago. The problem occurs constantly. The problem has been unchanged. Associated symptoms include congestion, coughing, fatigue and a fever. Pertinent negatives include no abdominal pain, arthralgias, chest pain, headaches, joint swelling, myalgias, nausea, neck pain, numbness, rash, sore throat, visual change or vomiting. The symptoms are aggravated by swallowing. She has tried acetaminophen for the symptoms. Improvement on treatment: transient relief.    Past Medical History  Diagnosis Date  . General counseling for prescription of oral contraceptives   . Other general counseling and advice for contraceptive management     Past Surgical History  Procedure Date  . Back surgery     for scoliosis     Family History  Problem Relation Age of Onset  . Hypertension Mother     History  Substance Use Topics  . Smoking status: Never Smoker   . Smokeless tobacco: Not on file  . Alcohol Use: No    OB History    Grav Para Term Preterm Abortions TAB SAB Ect Mult Living                  Review of Systems  Constitutional: Positive for fever and fatigue.  HENT: Positive for congestion. Negative for sore throat and neck pain.   Eyes: Negative.   Respiratory: Positive for cough. Negative for chest tightness and shortness of breath.   Cardiovascular: Negative for chest pain.  Gastrointestinal: Negative for nausea, vomiting and abdominal pain.  Genitourinary: Negative.   Musculoskeletal: Negative for myalgias, joint swelling  and arthralgias.  Skin: Negative.  Negative for rash and wound.  Neurological: Negative for dizziness, light-headedness, numbness and headaches.  Hematological: Negative.   Psychiatric/Behavioral: Negative.     Allergies  Review of patient's allergies indicates no known allergies.  Home Medications   Current Outpatient Rx  Name Route Sig Dispense Refill  . MEDROXYPROGESTERONE ACETATE 150 MG/ML IM SUSP Intramuscular Inject 150 mg into the muscle every 3 (three) months.      BP 118/73  Pulse 113  Temp 99.5 F (37.5 C)  Resp 16  Ht 5\' 5"  (1.651 m)  Wt 120 lb (54.432 kg)  BMI 19.97 kg/m2  SpO2 99%  Physical Exam  Nursing note and vitals reviewed. Constitutional: She is oriented to person, place, and time. She appears well-developed and well-nourished.  HENT:  Head: Normocephalic and atraumatic.  Right Ear: Tympanic membrane, external ear and ear canal normal.  Left Ear: Tympanic membrane, external ear and ear canal normal.  Nose: Mucosal edema and rhinorrhea present. Right sinus exhibits no maxillary sinus tenderness and no frontal sinus tenderness. Left sinus exhibits no maxillary sinus tenderness and no frontal sinus tenderness.  Mouth/Throat: Uvula is midline and mucous membranes are normal. Posterior oropharyngeal erythema present. No oropharyngeal exudate, posterior oropharyngeal edema or tonsillar abscesses.  Eyes: Conjunctivae are normal.  Neck: Normal range of motion.  Cardiovascular: Normal rate, regular rhythm, normal heart sounds and intact distal pulses.   Pulmonary/Chest: Effort normal and breath sounds normal. She has no wheezes.  Abdominal: Soft. Bowel sounds are  normal. There is no tenderness.  Musculoskeletal: Normal range of motion.  Lymphadenopathy:    She has no cervical adenopathy.  Neurological: She is alert and oriented to person, place, and time.  Skin: Skin is warm and dry.  Psychiatric: She has a normal mood and affect.    ED Course  Procedures  (including critical care time)   Labs Reviewed  RAPID STREP SCREEN  LAB REPORT - SCANNED   No results found.   1. Viral pharyngitis     Strep negative.  While here,  RN noted pt had blood mixed with clear nasal discharge when blowing nose.  Aftrin 2 sprays given,  Pressure held.  At re-exam,  No evidence of blood or bleeding source seen in nares   MDM  URI/viral pharyngitis.  Pt was encouraged to rest,  Increase fluids.  Avoid vigorous nose blowing.  Afrin given with instructions if bleed resumes.  Strep negative.  NO sinus pressure to suggest sinusitis.        Candis Musa, PA 05/29/11 1349

## 2011-05-31 NOTE — ED Provider Notes (Signed)
Medical screening examination/treatment/procedure(s) were performed by non-physician practitioner and as supervising physician I was immediately available for consultation/collaboration.  Eyden Dobie L Talor [place], MD 05/31/11 1132 

## 2011-07-10 ENCOUNTER — Encounter: Payer: Self-pay | Admitting: Family Medicine

## 2011-07-10 ENCOUNTER — Ambulatory Visit (INDEPENDENT_AMBULATORY_CARE_PROVIDER_SITE_OTHER): Payer: BC Managed Care – PPO | Admitting: Family Medicine

## 2011-07-10 VITALS — BP 110/70 | HR 96 | Resp 16 | Ht 65.0 in | Wt 137.4 lb

## 2011-07-10 DIAGNOSIS — R109 Unspecified abdominal pain: Secondary | ICD-10-CM

## 2011-07-10 DIAGNOSIS — N739 Female pelvic inflammatory disease, unspecified: Secondary | ICD-10-CM

## 2011-07-10 DIAGNOSIS — N730 Acute parametritis and pelvic cellulitis: Secondary | ICD-10-CM

## 2011-07-10 DIAGNOSIS — K5900 Constipation, unspecified: Secondary | ICD-10-CM

## 2011-07-10 LAB — POCT URINALYSIS DIPSTICK
Bilirubin, UA: NEGATIVE
Blood, UA: NEGATIVE
Protein, UA: NEGATIVE
Spec Grav, UA: >=1.03
pH, UA: 6

## 2011-07-10 MED ORDER — POLYETHYLENE GLYCOL 3350 17 GM/SCOOP PO POWD: 17.0000 g | Freq: Every day | ORAL | Status: DC

## 2011-07-10 MED ORDER — DOXYCYCLINE HYCLATE 100 MG PO TABS: 100.0000 mg | ORAL_TABLET | Freq: Two times a day (BID) | ORAL | Status: AC

## 2011-07-10 MED ORDER — CEFTRIAXONE SODIUM 1 G IJ SOLR
250.0000 mg | Freq: Once | INTRAMUSCULAR | Status: AC
  Administered 2011-07-10: 250 mg via INTRAMUSCULAR

## 2011-07-10 NOTE — Patient Instructions (Addendum)
Drink the miralax once a day, use 1 capfull in a cup of clear liquid, water or juice  I am treating for pelvic inflammatory disease. Take the antibiotics as prescribed  Pelvic Inflammatory Disease Pelvic inflammatory disease (PID) is an infection of some, or all, of the female pelvic organs. PID is caused by germs. HOME CARE  Take your medicine as told. Finish them even if you start to feel better.   Only take medicine as told by your doctor.   Do not have sex (intercourse) until the infection is gone.   Tell your sex partner you have PID. Treatment may be needed.   Keep your follow-up appointments.  GET HELP RIGHT AWAY IF:    You have a fever.   You have an increase in belly (abdominal) pain.   You start to get the chills.   You have pain when you pee (urinate).   You are not better after 72 hours.   Your sex partner tells you he or she has an STD.   You throw up (vomit).   You cannot take your medicines.  MAKE SURE YOU:    Understand these instructions.   Will watch your condition.   Will get help right away if you are not doing well or get worse.  Document Released: 05/22/2008 Document Revised: 02/12/2011 Document Reviewed: 06/25/2009 [facility] Patient Information 2012 [place], [place].

## 2011-07-12 ENCOUNTER — Encounter: Payer: Self-pay | Admitting: Family Medicine

## 2011-07-12 DIAGNOSIS — K5900 Constipation, unspecified: Secondary | ICD-10-CM | POA: Insufficient documentation

## 2011-07-12 DIAGNOSIS — R109 Unspecified abdominal pain: Secondary | ICD-10-CM | POA: Insufficient documentation

## 2011-07-12 DIAGNOSIS — N730 Acute parametritis and pelvic cellulitis: Secondary | ICD-10-CM | POA: Insufficient documentation

## 2011-07-12 NOTE — Assessment & Plan Note (Signed)
Will treat for PID, rocephin 250mg  IM given, doxycycline x 10 days. Cultures taken, will await results Upreg neg UA neg

## 2011-07-12 NOTE — Assessment & Plan Note (Signed)
I think this is a combination of the PID and constipation, no red flags,, she looks well appearing

## 2011-07-12 NOTE — Assessment & Plan Note (Signed)
Trial of miralax, increase fluids and fiber

## 2011-07-12 NOTE — Progress Notes (Signed)
  Subjective:    Patient ID: Elizabeth Gardner, female    DOB: February 16, 1986, 26 y.o.   MRN: [id number]  HPI  Abdominal and pelvic pain x 1 week, has pain when she urinates and pressure in vaginal area. +vaginal discharge. No period secondary to Depo Provera for contraception. Has hard stools and only has BM twice a week which also causes pain. Her boyfriend recently cheated with another female.    Review of Systems - per above  GEN- denies fatigue, fever, weight loss,weakness, recent illness HEENT- denies eye drainage, change in vision, nasal discharge, CVS- denies chest pain, palpitations RESP- denies SOB, cough, wheeze ABD- denies N/V, change in stools, +abd pain GU- + dysuria,denies  hematuria, dribbling, incontinence MSK- denies joint pain, muscle aches, injury Neuro- denies headache, dizziness, syncope, seizure activity       Objective:   Physical Exam GEN- NAD, alert and oriented x3 HEENT- PERRL, EOMI, non injected sclera, pink conjunctiva, MMM, oropharynx clear CVS- RRR, no murmur RESP-CTAB ABD-NABS,soft, mild suprapubic tenderness, no rebound, no guarding, no cva tenderness GU- normal external genitalia, vaginal mucosa pink and moist, cervix visualized no growth, no blood form os, + white thick discharge, + CMT, no ovarian masses, uterus normal size, pain with exam EXT- No edema Pulses- Radial, DP- 2+         Assessment & Plan:

## 2011-07-14 ENCOUNTER — Telehealth: Payer: Self-pay | Admitting: Family Medicine

## 2011-07-14 LAB — WET PREP BY MOLECULAR PROBE: Candida species: NEGATIVE

## 2011-07-14 LAB — GC/CHLAMYDIA PROBE AMP, GENITAL: Chlamydia, DNA Probe: NEGATIVE

## 2011-07-14 NOTE — Telephone Encounter (Signed)
Pt aware of results 

## 2011-08-06 ENCOUNTER — Ambulatory Visit (INDEPENDENT_AMBULATORY_CARE_PROVIDER_SITE_OTHER): Payer: BC Managed Care – PPO

## 2011-08-06 VITALS — BP 120/80 | Wt 139.0 lb

## 2011-08-06 DIAGNOSIS — Z309 Encounter for contraceptive management, unspecified: Secondary | ICD-10-CM

## 2011-08-06 MED ORDER — MEDROXYPROGESTERONE ACETATE 150 MG/ML IM SUSP
150.0000 mg | Freq: Once | INTRAMUSCULAR | Status: AC
  Administered 2011-08-06: 150 mg via INTRAMUSCULAR

## 2011-08-06 NOTE — Progress Notes (Signed)
Received injection with no complications  

## 2011-10-29 ENCOUNTER — Ambulatory Visit (INDEPENDENT_AMBULATORY_CARE_PROVIDER_SITE_OTHER): Payer: Medicaid Other

## 2011-10-29 VITALS — BP 126/76 | Wt 132.0 lb

## 2011-10-29 DIAGNOSIS — Z309 Encounter for contraceptive management, unspecified: Secondary | ICD-10-CM

## 2011-10-29 MED ORDER — MEDROXYPROGESTERONE ACETATE 150 MG/ML IM SUSP
150.0000 mg | Freq: Once | INTRAMUSCULAR | Status: AC
  Administered 2011-10-29: 150 mg via INTRAMUSCULAR

## 2011-10-29 NOTE — Progress Notes (Signed)
Pt in for depo provera injection.  Injection given in right deltoid.  No sign or symptom of adverse reaction.  No voiced complaints.  Pt aware that next injection due Nov. 14th.

## 2011-12-03 ENCOUNTER — Other Ambulatory Visit (HOSPITAL_COMMUNITY)
Admission: RE | Admit: 2011-12-03 | Discharge: 2011-12-03 | Disposition: A | Discharge status: Home / Self Care | Payer: Medicaid Other | Source: Ambulatory Visit | Attending: Family Medicine | Admitting: Family Medicine

## 2011-12-03 ENCOUNTER — Ambulatory Visit (INDEPENDENT_AMBULATORY_CARE_PROVIDER_SITE_OTHER): Payer: Medicaid Other | Admitting: Family Medicine

## 2011-12-03 ENCOUNTER — Encounter: Payer: Self-pay | Admitting: Family Medicine

## 2011-12-03 VITALS — BP 112/72 | HR 93 | Resp 16 | Ht 65.0 in | Wt 135.0 lb

## 2011-12-03 DIAGNOSIS — Z01419 Encounter for gynecological examination (general) (routine) without abnormal findings: Secondary | ICD-10-CM | POA: Insufficient documentation

## 2011-12-03 DIAGNOSIS — Z113 Encounter for screening for infections with a predominantly sexual mode of transmission: Secondary | ICD-10-CM | POA: Insufficient documentation

## 2011-12-03 DIAGNOSIS — Z0000 Encounter for general adult medical examination without abnormal findings: Secondary | ICD-10-CM | POA: Insufficient documentation

## 2011-12-03 DIAGNOSIS — J309 Allergic rhinitis, unspecified: Secondary | ICD-10-CM | POA: Insufficient documentation

## 2011-12-03 DIAGNOSIS — D649 Anemia, unspecified: Secondary | ICD-10-CM

## 2011-12-03 DIAGNOSIS — N760 Acute vaginitis: Secondary | ICD-10-CM

## 2011-12-03 DIAGNOSIS — Z23 Encounter for immunization: Secondary | ICD-10-CM

## 2011-12-03 DIAGNOSIS — H9209 Otalgia, unspecified ear: Secondary | ICD-10-CM

## 2011-12-03 DIAGNOSIS — Z124 Encounter for screening for malignant neoplasm of cervix: Secondary | ICD-10-CM

## 2011-12-03 DIAGNOSIS — D509 Iron deficiency anemia, unspecified: Secondary | ICD-10-CM | POA: Insufficient documentation

## 2011-12-03 DIAGNOSIS — H9202 Otalgia, left ear: Secondary | ICD-10-CM | POA: Insufficient documentation

## 2011-12-03 LAB — CBC WITH DIFFERENTIAL/PLATELET
Basophils Relative: 0 % (ref 0–1)
Eosinophils Absolute: 0.2 10*3/uL (ref 0.0–0.7)
HCT: 35.4 % — ABNORMAL LOW (ref 36.0–46.0)
Hemoglobin: 11.1 g/dL — ABNORMAL LOW (ref 12.0–15.0)
MCH: 22.3 pg — ABNORMAL LOW (ref 26.0–34.0)
MCHC: 31.4 g/dL (ref 30.0–36.0)
Monocytes Absolute: 0.5 10*3/uL (ref 0.1–1.0)
Monocytes Relative: 7 % (ref 3–12)

## 2011-12-03 LAB — IRON: Iron: 102 ug/dL (ref 42–145)

## 2011-12-03 MED ORDER — ANTIPYRINE-BENZOCAINE 5.4-1.4 % OT SOLN: 3.0000 [drp] | OTIC | Status: DC | PRN

## 2011-12-03 MED ORDER — LORATADINE 10 MG PO TABS: 10.0000 mg | ORAL_TABLET | Freq: Every day | ORAL | Status: DC

## 2011-12-03 NOTE — Assessment & Plan Note (Signed)
Updated lab today 

## 2011-12-03 NOTE — Progress Notes (Signed)
  Subjective:    Patient ID: Elizabeth Gardner, female    DOB: Mar 29, 1985, 26 y.o.   MRN: [id number]  HPI Pt in for annual exam. C/o intermittent left ear pain x 1 week, no drainage or hearing loss. C/o left supraorbital discomfort with uncontrolled allergy symptoms x 2 weeks Denies fever, chills   Review of Systems See HPI Denies recent fever or chills. Denies sinus pressure, nasal congestion,or sore throat. Denies chest congestion, productive cough or wheezing. Denies chest pains, palpitations and leg swelling Denies abdominal pain, nausea, vomiting,diarrhea or constipation.   Denies dysuria, frequency, hesitancy or incontinence. Denies joint pain, swelling and limitation in mobility. Denies headaches, seizures, numbness, or tingling. Denies depression, anxiety or insomnia. Denies skin break down or rash.        Objective:   Physical Exam  Pleasant well nourished female, alert and oriented x 3, in no cardio-pulmonary distress. Afebrile. HEENT No facial trauma or asymetry. Sinuses non tender.  EOMI, PERTL, fundoscopic exam is normal, no hemorhage or exudate.  External ears normal, tympanic membranes clear.Erythema of left ear earcanal Oropharynx moist, no exudate, fair  Dentition.Has caries, plans to address this Neck: supple, no adenopathy,JVD or thyromegaly.No bruits.  Chest: Clear to ascultation bilaterally.No crackles or wheezes. Non tender to palpation  Breast: No asymetry,no masses. No nipple discharge or inversion. No axillary or supraclavicular adenopathy  Cardiovascular system; Heart sounds normal,  S1 and  S2 ,no S3.  No murmur, or thrill. Apical beat not displaced Peripheral pulses normal.  Abdomen: Soft, non tender, no organomegaly or masses. No bruits. Bowel sounds normal. No guarding, tenderness or rebound.  GU: External genitalia normal. No lesions. Vaginal canal normal.thick white  discharge. Uterus normal size, no adnexal masses, no cervical  motion or adnexal tenderness.  Musculoskeletal exam: Full ROM of spine, hips , shoulders and knees. No deformity ,swelling or crepitus noted. No muscle wasting or atrophy.   Neurologic: Cranial nerves 2 to 12 intact. Power, tone ,sensation and reflexes normal throughout. No disturbance in gait. No tremor.  Skin: Intact, no ulceration, erythema , scaling or rash noted. Pigmentation normal throughout  Psych; Normal mood and affect. Judgement and concentration normal       Assessment & Plan:

## 2011-12-03 NOTE — Assessment & Plan Note (Signed)
Topical drops for pain and counseled to discontinue use of qtips

## 2011-12-03 NOTE — Patient Instructions (Addendum)
CPE September 27 or after.  Keep appointment for depo please  Use condoms regualrly for STD protection  Flu vaccine today.  Medication sent for left ear pain, pls do not put Qtips in the ear  Use sudafed one daily as needed for excessive sinus pressure  You will get a script for generic claritin, fill where able for $4  CBC , iron and ferritin level today

## 2011-12-03 NOTE — Assessment & Plan Note (Signed)
Uncontrolled symptoms , med prescribed and recommended

## 2011-12-03 NOTE — Assessment & Plan Note (Signed)
Pelvic and breast exam documented. Pt has had treatment for abnormal pap smear, os is stenotic, she has copious white vaginal discharge Counseled re the importance of condo ise for safe sex Healthy diet and regular physical activity encouraged

## 2011-12-04 DIAGNOSIS — Z23 Encounter for immunization: Secondary | ICD-10-CM

## 2012-01-21 ENCOUNTER — Ambulatory Visit (INDEPENDENT_AMBULATORY_CARE_PROVIDER_SITE_OTHER): Payer: Medicaid Other

## 2012-01-21 VITALS — BP 110/70 | Wt 136.0 lb

## 2012-01-21 DIAGNOSIS — Z309 Encounter for contraceptive management, unspecified: Secondary | ICD-10-CM

## 2012-01-21 MED ORDER — MEDROXYPROGESTERONE ACETATE 150 MG/ML IM SUSP
150.0000 mg | Freq: Once | INTRAMUSCULAR | Status: AC
  Administered 2012-01-21: 150 mg via INTRAMUSCULAR

## 2012-01-21 NOTE — Progress Notes (Signed)
Pt received injection in right arm with no complications

## 2012-04-14 ENCOUNTER — Telehealth: Payer: Self-pay

## 2012-04-14 ENCOUNTER — Ambulatory Visit (INDEPENDENT_AMBULATORY_CARE_PROVIDER_SITE_OTHER): Payer: Medicaid Other

## 2012-04-14 VITALS — BP 100/70 | Wt 138.4 lb

## 2012-04-14 DIAGNOSIS — Z309 Encounter for contraceptive management, unspecified: Secondary | ICD-10-CM

## 2012-04-14 MED ORDER — MEDROXYPROGESTERONE ACETATE 150 MG/ML IM SUSP
150.0000 mg | Freq: Once | INTRAMUSCULAR | Status: AC
  Administered 2012-04-14: 150 mg via INTRAMUSCULAR

## 2012-04-14 NOTE — Telephone Encounter (Signed)
Got her depo injection today and she wants this to be her last shot and she wants to go on the pills. Wants to know when she can make the transition and if she needed to come in for an OV and when.

## 2012-04-14 NOTE — Progress Notes (Signed)
Pt received injection with no complications in the right deltoid

## 2012-04-25 ENCOUNTER — Telehealth: Payer: Self-pay | Admitting: Family Medicine

## 2012-04-25 NOTE — Telephone Encounter (Signed)
Definitely does not need OCP before 1 month after the shot , I am checking on this pls let her know

## 2012-04-25 NOTE — Telephone Encounter (Signed)
Wants to go to [facility] pills instead of the depo injection

## 2012-04-26 NOTE — Telephone Encounter (Signed)
Patient aware.

## 2012-05-09 ENCOUNTER — Other Ambulatory Visit: Payer: Self-pay | Admitting: Family Medicine

## 2012-05-09 MED ORDER — NORGESTIM-ETH ESTRAD TRIPHASIC 0.18/0.215/0.25 MG-25 MCG PO TABS: 1.0000 | ORAL_TABLET | Freq: Every day | ORAL | Status: DC

## 2012-05-09 NOTE — Telephone Encounter (Signed)
orthotricyclen sent in for pt to start asap, last depo was approx 4 weeks ago. I left her a message to call me so that she knows medication is at the pharmacy

## 2012-05-09 NOTE — Telephone Encounter (Signed)
Pt aware that medication has been sent to her pharmacy and she can start the OCP now

## 2012-05-10 ENCOUNTER — Ambulatory Visit: Payer: Medicaid Other | Admitting: Family Medicine

## 2012-05-12 ENCOUNTER — Ambulatory Visit (INDEPENDENT_AMBULATORY_CARE_PROVIDER_SITE_OTHER): Payer: Medicaid Other | Admitting: Family Medicine

## 2012-05-12 ENCOUNTER — Other Ambulatory Visit (HOSPITAL_COMMUNITY)
Admission: RE | Admit: 2012-05-12 | Discharge: 2012-05-12 | Disposition: A | Discharge status: Home / Self Care | Payer: Medicaid Other | Source: Ambulatory Visit | Attending: Family Medicine | Admitting: Family Medicine

## 2012-05-12 ENCOUNTER — Encounter: Payer: Self-pay | Admitting: Family Medicine

## 2012-05-12 VITALS — BP 106/70 | HR 84 | Resp 18 | Ht 65.0 in | Wt 134.0 lb

## 2012-05-12 DIAGNOSIS — Z113 Encounter for screening for infections with a predominantly sexual mode of transmission: Secondary | ICD-10-CM | POA: Insufficient documentation

## 2012-05-12 DIAGNOSIS — Z202 Contact with and (suspected) exposure to infections with a predominantly sexual mode of transmission: Secondary | ICD-10-CM

## 2012-05-12 DIAGNOSIS — Z2089 Contact with and (suspected) exposure to other communicable diseases: Secondary | ICD-10-CM

## 2012-05-12 DIAGNOSIS — N760 Acute vaginitis: Secondary | ICD-10-CM | POA: Insufficient documentation

## 2012-05-12 NOTE — Patient Instructions (Signed)
We will call with results next week Call if your birth control does not go through F/U as previous

## 2012-05-12 NOTE — Progress Notes (Signed)
  Subjective:    Patient ID: Elizabeth Gardner, female    DOB: 1985/04/10, 27 y.o.   MRN: [id number]  HPI  Patient here for STD testing. She has no specific concerns. She had a significant other who is in another relationship. She's not had any vaginal bleeding, discharge, abdominal pain  Review of Systems  GEN- denies fatigue, fever, weight loss,weakness, recent illness HEENT- denies eye drainage, change in vision, nasal discharge, CVS- denies chest pain, palpitations RESP- denies SOB, cough, wheeze ABD- denies N/V, change in stools, abd pain GU- denies dysuria, hematuria, dribbling, incontinence MSK- denies joint pain, muscle aches, injury Neuro- denies headache, dizziness, syncope, seizure activity      Objective:   Physical Exam  GEN- NAD, alert and oriented x 3  GU- normal external genitalia, vaginal mucosa pink and moist, cervix visualized no growth, no blood form os, minimal thin clear discharge, no CMT, no ovarian masses, uterus normal size        Assessment & Plan:    STD exposure- Cultures taken, HIV and RPR to be done. Discussed safe sex  On birth control, currently with Depo, trying to get her medicaid to run through for [facility], she will call if she has any further difficulty, Depo is still active

## 2012-06-01 ENCOUNTER — Other Ambulatory Visit: Payer: Self-pay | Admitting: Family Medicine

## 2012-06-01 ENCOUNTER — Ambulatory Visit (INDEPENDENT_AMBULATORY_CARE_PROVIDER_SITE_OTHER): Payer: Medicaid Other

## 2012-06-01 VITALS — BP 120/80 | Wt 136.4 lb

## 2012-06-01 DIAGNOSIS — Z111 Encounter for screening for respiratory tuberculosis: Secondary | ICD-10-CM

## 2012-06-03 LAB — TB SKIN TEST
Induration: 0 mm
TB Skin Test: NEGATIVE

## 2012-06-03 NOTE — Progress Notes (Signed)
PPD placed in left arm with no complications

## 2012-07-14 ENCOUNTER — Emergency Department (HOSPITAL_COMMUNITY): Payer: Medicaid Other

## 2012-07-14 ENCOUNTER — Encounter (HOSPITAL_COMMUNITY): Payer: Self-pay | Admitting: Emergency Medicine

## 2012-07-14 ENCOUNTER — Emergency Department (HOSPITAL_COMMUNITY)
Admission: EM | Admit: 2012-07-14 | Discharge: 2012-07-15 | Disposition: A | Discharge status: Home / Self Care | Payer: Medicaid Other | Attending: Emergency Medicine | Admitting: Emergency Medicine

## 2012-07-14 ENCOUNTER — Telehealth: Payer: Self-pay | Admitting: Family Medicine

## 2012-07-14 DIAGNOSIS — R109 Unspecified abdominal pain: Secondary | ICD-10-CM

## 2012-07-14 DIAGNOSIS — Z3202 Encounter for pregnancy test, result negative: Secondary | ICD-10-CM | POA: Insufficient documentation

## 2012-07-14 LAB — URINALYSIS, ROUTINE W REFLEX MICROSCOPIC
Bilirubin Urine: NEGATIVE
Ketones, ur: NEGATIVE mg/dL
Leukocytes, UA: NEGATIVE
Nitrite: NEGATIVE
Protein, ur: NEGATIVE mg/dL
Urobilinogen, UA: 0.2 mg/dL (ref 0.0–1.0)
pH: 6.5 (ref 5.0–8.0)

## 2012-07-14 LAB — URINE MICROSCOPIC-ADD ON

## 2012-07-14 NOTE — ED Notes (Signed)
Pt c/o severe abd pain since yesterday. Pt has recently switched from depo shot to oral contraceptives and has not had a period.

## 2012-07-14 NOTE — Telephone Encounter (Signed)
Called patient and left message for them to return call at the office   

## 2012-07-14 NOTE — Telephone Encounter (Signed)
Totally agree with advice given

## 2012-07-14 NOTE — Telephone Encounter (Signed)
States she had a period lastmonth and it was normal. This month while on the sugar pills she is noticing brown spotting blood. Was concerned. I told her I would check with you but that brown blood was old blood and it should be fine as she was adjusting to [facility] pills. Please advise

## 2012-07-14 NOTE — ED Notes (Signed)
Pt states pelvic pain for the past 2 days. Just came off depo shots & started taking pills. Had period last month none this month, pt states did have some brown discharge when she wiped yesterday.

## 2012-07-14 NOTE — ED Provider Notes (Signed)
History     CSN: [id number]  Arrival date & time 07/14/12  2034   First MD Initiated Contact with Patient 07/14/12 2301      Chief Complaint  Patient presents with  . Abdominal Pain    (Consider location/radiation/quality/duration/timing/severity/associated sxs/prior treatment) HPI HPI Comments: Elizabeth Gardner is a 27 y.o. female who presents to the Emergency Department complaining of sharp stabbing pains that are in the lower abdominal  and began yesterday. They are intermittent pains that go from the umbilicus to the mons. She had a bowel movement yesterday and today without diarrhea. Denies nausea, vomiting, fever, chills. She is currently on PO oral contraceptives and had switched from DEPO. She has yet to have a period.   PCP Dr. Lodema Hong Past Medical History  Diagnosis Date  . General counseling for prescription of oral contraceptives   . Other general counseling and advice for contraceptive management     Past Surgical History  Procedure Laterality Date  . Back surgery      for scoliosis     Family History  Problem Relation Age of Onset  . Hypertension Mother     History  Substance Use Topics  . Smoking status: Never Smoker   . Smokeless tobacco: Not on file  . Alcohol Use: No    OB History   Grav Para Term Preterm Abortions TAB SAB Ect Mult Living                  Review of Systems  Constitutional: Negative for fever.       10 Systems reviewed and are negative for acute change except as noted in the HPI.  HENT: Negative for congestion.   Eyes: Negative for discharge and redness.  Respiratory: Negative for cough and shortness of breath.   Cardiovascular: Negative for chest pain.  Gastrointestinal: Positive for abdominal pain. Negative for vomiting.  Musculoskeletal: Negative for back pain.  Skin: Negative for rash.  Neurological: Negative for syncope, numbness and headaches.  Psychiatric/Behavioral:       No behavior change.    Allergies  Review  of patient's allergies indicates no known allergies.  Home Medications   Current Outpatient Rx  Name  Route  Sig  Dispense  Refill  . acetaminophen (TYLENOL) 500 MG tablet   Oral   Take 500 mg by mouth once as needed for pain.         . Norgestimate-Ethinyl Estradiol Triphasic 0.18/0.215/0.25 MG-25 MCG tab   Oral   Take 1 tablet by mouth every morning.           BP 119/37  Pulse 55  Temp(Src) 98.5 F (36.9 C) (Oral)  Resp 18  Ht 5\' 3"  (1.6 m)  Wt 138 lb (62.596 kg)  BMI 24.45 kg/m2  SpO2 100%  LMP 06/20/2012  Physical Exam  Nursing note and vitals reviewed. Constitutional: She appears well-developed and well-nourished.  Awake, alert, nontoxic appearance.  HENT:  Head: Normocephalic and atraumatic.  Eyes: EOM are normal. Pupils are equal, round, and reactive to light.  Neck: Normal range of motion. Neck supple.  Cardiovascular: Normal rate and intact distal pulses.   Pulmonary/Chest: Effort normal and breath sounds normal. She exhibits no tenderness.  Abdominal: Soft. There is no tenderness. There is no rebound.  Small area of tenderness to the suprapubic area.  Musculoskeletal: She exhibits no tenderness.  Baseline ROM, no obvious new focal weakness.  Neurological:  Mental status and motor strength appears baseline for patient and situation.  Skin: No rash noted.  Psychiatric: She has a normal mood and affect.    ED Course  Procedures (including critical care time) Results for orders placed during the hospital encounter of 07/14/12  URINALYSIS, ROUTINE W REFLEX MICROSCOPIC      Result Value Range   Color, Urine YELLOW  YELLOW   APPearance CLEAR  CLEAR   Specific Gravity, Urine 1.015  1.005 - 1.030   pH 6.5  5.0 - 8.0   Glucose, UA NEGATIVE  NEGATIVE mg/dL   Hgb urine dipstick LARGE (*) NEGATIVE   Bilirubin Urine NEGATIVE  NEGATIVE   Ketones, ur NEGATIVE  NEGATIVE mg/dL   Protein, ur NEGATIVE  NEGATIVE mg/dL   Urobilinogen, UA 0.2  0.0 - 1.0 mg/dL    Nitrite NEGATIVE  NEGATIVE   Leukocytes, UA NEGATIVE  NEGATIVE  PREGNANCY, URINE      Result Value Range   Preg Test, Ur NEGATIVE  NEGATIVE  URINE MICROSCOPIC-ADD ON      Result Value Range   Squamous Epithelial / LPF FEW (*) RARE   WBC, UA 0-2  <3 WBC/hpf   RBC / HPF 0-2  <3 RBC/hpf     Dg Abd Acute W/chest  07/14/2012  *RADIOLOGY REPORT*  Clinical Data: 27 year old female with abdominal pain. Periumbilical pain.  Nausea.  ACUTE ABDOMEN SERIES (ABDOMEN 2 VIEW & CHEST 1 VIEW)  Comparison: None.  Findings: Multilevel thoracic and lumbar transpedicular spinal fusion hardware with connecting rods.  There is a degree of underlying scoliosis.  Lung volumes are within normal limits.  No pneumothorax or pneumoperitoneum.  Cardiac size and mediastinal contours are within normal limits.  Visualized tracheal air column is within normal limits.  No confluent pulmonary opacity.  Nonobstructed bowel gas pattern.  Focal increased gas at the splenic flexure of uncertain significance.  Abdominal and pelvic visceral contours appear within normal limits. No acute osseous abnormality identified.  IMPRESSION: Nonobstructed bowel gas pattern, no free air. No acute cardiopulmonary abnormality. Sequelae of dorsal lumbar transpedicular fusion.   Original Report Authenticated By: Erskine Speed, M.D.     MDM  Patient here with sharp lower abdominal pain. Xray shows gas and stool. Spoke with patient regarding the findings. Given a copy of her film. Pt stable in ED with no significant deterioration in condition.The patient appears reasonably screened and/or stabilized for discharge and I doubt any other medical condition or other [facility] requiring further screening, evaluation, or treatment in the ED at this time prior to discharge.  MDM Reviewed: nursing note and vitals Interpretation: x-ray           Nicoletta Dress. Colon Branch, MD 07/15/12 [phone]

## 2012-07-15 NOTE — ED Notes (Signed)
Pt alert & oriented x4, stable gait. Patient given discharge instructions, paperwork & prescription(s). Patient  instructed to stop at the registration desk to finish any additional paperwork. Patient verbalized understanding. Pt left department w/ no further questions. 

## 2012-12-05 ENCOUNTER — Other Ambulatory Visit (HOSPITAL_COMMUNITY)
Admission: RE | Admit: 2012-12-05 | Discharge: 2012-12-05 | Disposition: A | Discharge status: Home / Self Care | Payer: Medicaid Other | Source: Ambulatory Visit | Attending: Family Medicine | Admitting: Family Medicine

## 2012-12-05 ENCOUNTER — Encounter: Payer: Self-pay | Admitting: Family Medicine

## 2012-12-05 ENCOUNTER — Other Ambulatory Visit: Payer: Self-pay | Admitting: Family Medicine

## 2012-12-05 ENCOUNTER — Ambulatory Visit (INDEPENDENT_AMBULATORY_CARE_PROVIDER_SITE_OTHER): Payer: Medicaid Other | Admitting: Family Medicine

## 2012-12-05 VITALS — BP 118/74 | HR 95 | Resp 16 | Ht 65.0 in | Wt 137.1 lb

## 2012-12-05 DIAGNOSIS — R51 Headache: Secondary | ICD-10-CM

## 2012-12-05 DIAGNOSIS — N760 Acute vaginitis: Secondary | ICD-10-CM | POA: Insufficient documentation

## 2012-12-05 DIAGNOSIS — Z0000 Encounter for general adult medical examination without abnormal findings: Secondary | ICD-10-CM

## 2012-12-05 DIAGNOSIS — Z23 Encounter for immunization: Secondary | ICD-10-CM

## 2012-12-05 DIAGNOSIS — Z124 Encounter for screening for malignant neoplasm of cervix: Secondary | ICD-10-CM

## 2012-12-05 DIAGNOSIS — Z113 Encounter for screening for infections with a predominantly sexual mode of transmission: Secondary | ICD-10-CM | POA: Insufficient documentation

## 2012-12-05 DIAGNOSIS — R519 Headache, unspecified: Secondary | ICD-10-CM | POA: Insufficient documentation

## 2012-12-05 DIAGNOSIS — Z01419 Encounter for gynecological examination (general) (routine) without abnormal findings: Secondary | ICD-10-CM | POA: Insufficient documentation

## 2012-12-05 DIAGNOSIS — R5381 Other malaise: Secondary | ICD-10-CM

## 2012-12-05 DIAGNOSIS — Z139 Encounter for screening, unspecified: Secondary | ICD-10-CM

## 2012-12-05 LAB — CBC WITH DIFFERENTIAL/PLATELET
Eosinophils Absolute: 0.3 10*3/uL (ref 0.0–0.7)
HCT: 34.1 % — ABNORMAL LOW (ref 36.0–46.0)
Hemoglobin: 11 g/dL — ABNORMAL LOW (ref 12.0–15.0)
Lymphs Abs: 1.9 10*3/uL (ref 0.7–4.0)
MCH: 22.3 pg — ABNORMAL LOW (ref 26.0–34.0)
MCHC: 32.3 g/dL (ref 30.0–36.0)
Monocytes Absolute: 0.6 10*3/uL (ref 0.1–1.0)
Monocytes Relative: 9 % (ref 3–12)
Neutro Abs: 3.9 10*3/uL (ref 1.7–7.7)
Neutrophils Relative %: 57 % (ref 43–77)
RBC: 4.94 MIL/uL (ref 3.87–5.11)

## 2012-12-05 LAB — BASIC METABOLIC PANEL
BUN: 8 mg/dL (ref 6–23)
CO2: 28 mEq/L (ref 19–32)
Glucose, Bld: 81 mg/dL (ref 70–99)
Potassium: 4.4 mEq/L (ref 3.5–5.3)
Sodium: 138 mEq/L (ref 135–145)

## 2012-12-05 LAB — TSH: TSH: 2.55 u[IU]/mL (ref 0.350–4.500)

## 2012-12-05 LAB — LIPID PANEL
Total CHOL/HDL Ratio: 2.9 Ratio
VLDL: 11 mg/dL (ref 0–40)

## 2012-12-05 MED ORDER — NORGESTIM-ETH ESTRAD TRIPHASIC 0.18/0.215/0.25 MG-25 MCG PO TABS: 1.0000 | ORAL_TABLET | Freq: Every morning | ORAL | Status: DC

## 2012-12-05 NOTE — Patient Instructions (Addendum)
CPE in 1 year.  Flu vaccine today  Use condoms to protect against sexually transmitted infection please  CBC, chem 7, lipid, TSH and vit d today.  Refill on contraception for 1 year  Remember to have a diet rich in fruit and vegetable and to remain active so that you remain healthy

## 2012-12-05 NOTE — Assessment & Plan Note (Signed)
Occasional, uses tylenol as needed

## 2012-12-05 NOTE — Progress Notes (Signed)
  Subjective:    Patient ID: Elizabeth Gardner, female    DOB: 01/21/1986, 27 y.o.   MRN: [id number]  HPI Pt in fro annual exam Only c/o dental caries and she has no insurance to cover dental work, not currently having pain Still no regualr condom use, wants STD testing Doing well with the OCP no adverse s/e and takes reguialrly   Review of Systems    See HPI Denies recent fever or chills. Denies sinus pressure, nasal congestion, ear pain or sore throat. Denies chest congestion, productive cough or wheezing. Denies chest pains, palpitations and leg swelling Denies abdominal pain, nausea, vomiting,diarrhea or constipation.   Denies dysuria, frequency, hesitancy or incontinence. Denies joint pain, swelling and limitation in mobility. Denies  seizures, numbness, or tingling.Has occasional headaches for which she takes tylenol as needed Denies depression, anxiety or insomnia. Denies skin break down or rash.     Objective:   Physical Exam  Pleasant well nourished female, alert and oriented x 3, in no cardio-pulmonary distress. Afebrile. HEENT No facial trauma or asymetry. Sinuses non tender.  EOMI, PERTL, fundoscopic exam is normal, no hemorhage or exudate.  External ears normal, tympanic membranes clear. Oropharynx moist, no exudate, dental caries x 3. Neck: supple, no adenopathy,JVD or thyromegaly.No bruits.  Chest: Clear to ascultation bilaterally.No crackles or wheezes. Non tender to palpation  Breast: No asymetry,no masses. No nipple discharge or inversion. No axillary or supraclavicular adenopathy  Cardiovascular system; Heart sounds normal,  S1 and  S2 ,no S3.  No murmur, or thrill. Apical beat not displaced Peripheral pulses normal.  Abdomen: Soft, non tender, no organomegaly or masses. No bruits. Bowel sounds normal. No guarding, tenderness or rebound.    GU: External genitalia normal. No lesions. Vaginal canal normal.white  discharge. Uterus normal  size, no adnexal masses, no cervical motion or adnexal tenderness.  Musculoskeletal exam: Full ROM of spine, hips , shoulders and knees. No deformity ,swelling or crepitus noted. No muscle wasting or atrophy.   Neurologic: Cranial nerves 2 to 12 intact. Power, tone ,sensation and reflexes normal throughout. No disturbance in gait. No tremor.  Skin: Intact, no ulceration, erythema , scaling or rash noted. Pigmentation normal throughout  Psych; Normal mood and affect. Judgement and concentration normal       Assessment & Plan:

## 2012-12-05 NOTE — Assessment & Plan Note (Signed)
Annual exam as documented Pt re educated re the need to use condoms for STI protection She is reminded of the need  For regular exercise and healthy diet  Labs today

## 2012-12-05 NOTE — Assessment & Plan Note (Signed)
Specimens sent, will treat based on results Condom use stressed

## 2012-12-06 LAB — IRON AND TIBC: Iron: 71 ug/dL (ref 42–145)

## 2012-12-06 LAB — VITAMIN D 25 HYDROXY (VIT D DEFICIENCY, FRACTURES): Vit D, 25-Hydroxy: 24 ng/mL — ABNORMAL LOW (ref 30–89)

## 2013-02-28 ENCOUNTER — Other Ambulatory Visit: Payer: Self-pay | Admitting: Family Medicine

## 2013-12-21 ENCOUNTER — Other Ambulatory Visit: Payer: Self-pay | Admitting: Family Medicine

## 2014-03-15 ENCOUNTER — Emergency Department (HOSPITAL_COMMUNITY): Payer: Medicaid Other

## 2014-03-15 ENCOUNTER — Emergency Department (HOSPITAL_COMMUNITY)
Admission: EM | Admit: 2014-03-15 | Discharge: 2014-03-15 | Disposition: A | Discharge status: Home / Self Care | Payer: Medicaid Other | Attending: Emergency Medicine | Admitting: Emergency Medicine

## 2014-03-15 ENCOUNTER — Encounter (HOSPITAL_COMMUNITY): Payer: Self-pay | Admitting: *Deleted

## 2014-03-15 DIAGNOSIS — S86911A Strain of unspecified muscle(s) and tendon(s) at lower leg level, right leg, initial encounter: Secondary | ICD-10-CM

## 2014-03-15 DIAGNOSIS — Y998 Other external cause status: Secondary | ICD-10-CM | POA: Insufficient documentation

## 2014-03-15 DIAGNOSIS — Y9389 Activity, other specified: Secondary | ICD-10-CM | POA: Insufficient documentation

## 2014-03-15 DIAGNOSIS — Y9289 Other specified places as the place of occurrence of the external cause: Secondary | ICD-10-CM | POA: Insufficient documentation

## 2014-03-15 DIAGNOSIS — X58XXXA Exposure to other specified factors, initial encounter: Secondary | ICD-10-CM | POA: Insufficient documentation

## 2014-03-15 MED ORDER — NAPROXEN 500 MG PO TABS: 500.0000 mg | ORAL_TABLET | Freq: Two times a day (BID) | ORAL | Status: DC

## 2014-03-15 MED ORDER — HYDROCODONE-ACETAMINOPHEN 5-325 MG PO TABS: 1.0000 | ORAL_TABLET | ORAL | Status: DC | PRN

## 2014-03-15 MED ORDER — IBUPROFEN 400 MG PO TABS
600.0000 mg | ORAL_TABLET | Freq: Once | ORAL | Status: AC
  Administered 2014-03-15: 600 mg via ORAL
  Filled 2014-03-15: qty 2

## 2014-03-15 NOTE — ED Provider Notes (Signed)
CSN: [id number]     Arrival date & time 03/15/14  1901 History   First MD Initiated Contact with Patient 03/15/14 1921     Chief Complaint  Patient presents with  . Leg Pain     (Consider location/radiation/quality/duration/timing/severity/associated sxs/prior Treatment) Patient is a 29 y.o. female presenting with leg pain. The history is provided by the patient.  Leg Pain Location:  Leg Time since incident:  2 days Injury: yes   Leg location:  R lower leg Pain details:    Quality:  Aching and burning   Radiates to:  Does not radiate   Severity:  Moderate   Onset quality:  Sudden   Timing:  Constant   Progression:  Unchanged Chronicity:  New Dislocation: no   Foreign body present:  No foreign bodies Prior injury to area:  No Relieved by:  Nothing Worsened by:  Activity  KHRISTIE SAK is a 29 y.o. female who presents to the ED with right lower leg pain. The pain started 2 days ago after she was going down steps and missed a step. She was able to catch herself and not fall but twisted her lower leg and it continues to hurt.  Past Medical History  Diagnosis Date  . General counseling for prescription of oral contraceptives   . Other general counseling and advice for contraceptive management    Past Surgical History  Procedure Laterality Date  . Back surgery      for scoliosis    Family History  Problem Relation Age of Onset  . Hypertension Mother    History  Substance Use Topics  . Smoking status: Never Smoker   . Smokeless tobacco: Not on file  . Alcohol Use: No   OB History    No data available     Review of Systems Negative except as stated in HPI   Allergies  Review of patient's allergies indicates no known allergies.  Home Medications   Prior to Admission medications   Medication Sig Start Date End Date Taking? Authorizing Provider  acetaminophen (TYLENOL) 500 MG tablet Take 500 mg by mouth once as needed for pain.    Historical Provider, MD    HYDROcodone-acetaminophen (NORCO/VICODIN) 5-325 MG per tablet Take 1 tablet by mouth every 4 (four) hours as needed. 03/15/14   Lucan, NP  naproxen (NAPROSYN) 500 MG tablet Take 1 tablet (500 mg total) by mouth 2 (two) times daily. 03/15/14   Hope Bunnie Pion, NP  ORTHO TRI-CYCLEN LO 0.18/0.215/0.25 MG-25 MCG tab TAKE 1 TABLET BY MOUTH EVERY DAY Patient not taking: Reported on 03/15/2014 02/28/13   Fayrene Helper, MD  ORTHO TRI-CYCLEN LO 0.18/0.215/0.25 MG-25 MCG tab TAKE 1 TABLET BY MOUTH EVERY MORNING Patient not taking: Reported on 03/15/2014 12/21/13   Fayrene Helper, MD   BP 133/76 mmHg  Pulse 84  Temp(Src) 98.7 F (37.1 C) (Oral)  Resp 18  Ht 5\' 3"  (1.6 m)  Wt 144 lb (65.318 kg)  BMI 25.51 kg/m2  SpO2 100%  LMP 02/21/2014 Physical Exam  Constitutional: She is oriented to person, place, and time. She appears well-developed and well-nourished.  HENT:  Head: Normocephalic.  Eyes: EOM are normal.  Neck: Normal range of motion. Neck supple.  Cardiovascular: Normal rate.   Pulmonary/Chest: Effort normal.  Musculoskeletal:       Right lower leg: She exhibits tenderness. She exhibits no swelling, no deformity and no laceration.       Legs: Tender with palpation to the  posterior aspect of the right lower leg. Pedal pulses 2+, adequate circulation, good touch sensation. Pain increases with movement and weight bearing.   Neurological: She is alert and oriented to person, place, and time. No cranial nerve deficit.  Skin: Skin is warm and dry.  Psychiatric: She has a normal mood and affect. Her behavior is normal.  Nursing note and vitals reviewed.   ED Course  Procedures (including critical care time) Labs Review Labs Reviewed - No data to display  Imaging Review Dg Tibia/fibula Right  03/15/2014   CLINICAL DATA:  Acute onset of right lower leg pain, after stumbling down stairs and twisting leg. Initial encounter.  EXAM: RIGHT TIBIA AND FIBULA - 2 VIEW  COMPARISON:  None.   FINDINGS: The tibia and fibula appear intact. Visualized joint spaces are preserved. The ankle mortise is incompletely assessed, but appears grossly unremarkable. A fabella is noted. The knee joint is incompletely assessed, but appears grossly unremarkable. No significant soft tissue abnormalities are characterized on radiograph.  IMPRESSION: No evidence of fracture or dislocation.   Electronically Signed   By: Garald Balding M.D.   On: 03/15/2014 19:58    Ace wrap applied, ice, pain management, crutches.  MDM  29 y.o. female with right lower leg pain s/p injury 2 days ago. Pain improved with ace wrap and use of crutches. Stable for discharge without neurovascular deficits. She will return for worsening symptoms.  Discussed with the patient clinical and x-ray findings and all questioned fully answered. Final diagnoses:  Muscle strain of right lower leg, initial encounter      [facility], NP 03/16/14 [place]. Alvino Chapel, MD 03/16/14 [phone]

## 2014-03-15 NOTE — ED Notes (Signed)
Pt going down the steps on Tuesday and missed a step, pt was able to keep from falling but today has pain to right lower leg

## 2014-03-15 NOTE — ED Notes (Signed)
Pt took ice pack, refused ace bandage and crutches, will purchase ace bandage to wrap lower right leg.

## 2015-04-11 ENCOUNTER — Encounter: Payer: Self-pay | Admitting: Family Medicine

## 2015-04-11 ENCOUNTER — Ambulatory Visit (INDEPENDENT_AMBULATORY_CARE_PROVIDER_SITE_OTHER): Payer: BLUE CROSS/BLUE SHIELD | Admitting: Family Medicine

## 2015-04-11 VITALS — BP 110/70 | HR 94 | Temp 100.0°F | Resp 16 | Ht 65.0 in | Wt 143.8 lb

## 2015-04-11 DIAGNOSIS — J0100 Acute maxillary sinusitis, unspecified: Secondary | ICD-10-CM | POA: Diagnosis not present

## 2015-04-11 DIAGNOSIS — Z308 Encounter for other contraceptive management: Secondary | ICD-10-CM

## 2015-04-11 DIAGNOSIS — G4489 Other headache syndrome: Secondary | ICD-10-CM | POA: Diagnosis not present

## 2015-04-11 DIAGNOSIS — J029 Acute pharyngitis, unspecified: Secondary | ICD-10-CM

## 2015-04-11 LAB — POCT URINE PREGNANCY: Preg Test, Ur: NEGATIVE

## 2015-04-11 LAB — POCT RAPID STREP A (OFFICE): Rapid Strep A Screen: NEGATIVE

## 2015-04-11 MED ORDER — NORGESTIM-ETH ESTRAD TRIPHASIC 0.18/0.215/0.25 MG-25 MCG PO TABS: 1.0000 | ORAL_TABLET | Freq: Every day | ORAL | Status: DC

## 2015-04-11 MED ORDER — AZITHROMYCIN 250 MG PO TABS: ORAL_TABLET | ORAL | Status: DC

## 2015-04-11 NOTE — Progress Notes (Signed)
   Subjective:    Patient ID: Elizabeth Gardner, female    DOB: [birth year], 30 y.o.   MRN: [id number]  HPI 5 day h/o sore throat, chills and low grade fever, bilateral ear pain and green sinus drainage, dry cough. Unsure f pregnancy status, needs to resume contraceptive pills, unprotected intercourse consistently for past 4 weeks     Review of Systems See HPI  Denies chest pains, palpitations and leg swelling Denies abdominal pain, nausea, vomiting,diarrhea or constipation.   Denies dysuria, frequency, hesitancy or incontinence. Denies joint pain, swelling and limitation in mobility. Denies headaches, seizures, numbness, or tingling. Denies depression, anxiety or insomnia. Denies skin break down or rash.        Objective:   Physical Exam BP 110/70 mmHg  Pulse 94  Temp(Src) 100 F (37.8 C) (Oral)  Resp 16  Ht 5\' 5"  (1.651 m)  Wt 143 lb 12.8 oz (65.227 kg)  BMI 23.93 kg/m2  SpO2 99%  Patient alert and oriented and in no cardiopulmonary distress.Ill appearing   HEENT: No facial asymmetry, EOMI,   oropharynx erythematous  and moist.  Neck supple bilateral cervical adenitis, TM dull, maxillary sinus tenderness Chest: Clear .  CVS: S1, S2 no murmurs, no S3.Regular rate.  ABD: Soft non tender.   Ext: No edema  MS: Adequate ROM spine, shoulders, hips and knees.  Skin: Intact, no ulcerations or rash noted.  Psych: Good eye contact, normal affect. Memory intact not anxious or depressed appearing.  CNS: CN 2-12 intact, power,  normal throughout.no focal deficits noted.      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

## 2015-04-11 NOTE — Patient Instructions (Signed)
cPE in March as before  You are treated for sore throat, medication sent to your pharmacy  Contraception will be sent for next 1 year  Tylenol for headache in office and work excuse for today return tomorrow.  Negative strep test and negative pregnancy test

## 2015-04-22 DIAGNOSIS — J0100 Acute maxillary sinusitis, unspecified: Secondary | ICD-10-CM | POA: Insufficient documentation

## 2015-04-22 DIAGNOSIS — Z309 Encounter for contraceptive management, unspecified: Secondary | ICD-10-CM | POA: Insufficient documentation

## 2015-04-22 NOTE — Assessment & Plan Note (Signed)
Normal neuro exam, occuring currently with acute illness, tylenol for pain

## 2015-04-22 NOTE — Assessment & Plan Note (Signed)
Pack prescribed

## 2015-04-22 NOTE — Assessment & Plan Note (Signed)
Out of  oCP x 1 month, concerned about possible pregnancy, urine pregnancy test in office is negative Script sent and advised to start pill on day one of next cycle

## 2015-04-22 NOTE — Assessment & Plan Note (Signed)
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

## 2015-04-22 NOTE — Addendum Note (Signed)
Addended by: Denman George B on: 04/22/2015 02:17 PM   Modules accepted: Orders

## 2015-05-24 ENCOUNTER — Ambulatory Visit (INDEPENDENT_AMBULATORY_CARE_PROVIDER_SITE_OTHER): Payer: BLUE CROSS/BLUE SHIELD | Admitting: Family Medicine

## 2015-05-24 ENCOUNTER — Other Ambulatory Visit (HOSPITAL_COMMUNITY)
Admission: RE | Admit: 2015-05-24 | Discharge: 2015-05-24 | Disposition: A | Discharge status: Home / Self Care | Payer: BLUE CROSS/BLUE SHIELD | Source: Ambulatory Visit | Attending: Family Medicine | Admitting: Family Medicine

## 2015-05-24 ENCOUNTER — Encounter: Payer: Self-pay | Admitting: Family Medicine

## 2015-05-24 ENCOUNTER — Other Ambulatory Visit: Payer: Self-pay | Admitting: Family Medicine

## 2015-05-24 VITALS — BP 112/78 | HR 78 | Resp 16 | Ht 65.0 in | Wt 144.0 lb

## 2015-05-24 DIAGNOSIS — Z113 Encounter for screening for infections with a predominantly sexual mode of transmission: Secondary | ICD-10-CM | POA: Insufficient documentation

## 2015-05-24 DIAGNOSIS — Z01419 Encounter for gynecological examination (general) (routine) without abnormal findings: Secondary | ICD-10-CM | POA: Insufficient documentation

## 2015-05-24 DIAGNOSIS — N760 Acute vaginitis: Secondary | ICD-10-CM

## 2015-05-24 DIAGNOSIS — D649 Anemia, unspecified: Secondary | ICD-10-CM

## 2015-05-24 DIAGNOSIS — K5900 Constipation, unspecified: Secondary | ICD-10-CM | POA: Insufficient documentation

## 2015-05-24 DIAGNOSIS — Z124 Encounter for screening for malignant neoplasm of cervix: Secondary | ICD-10-CM

## 2015-05-24 DIAGNOSIS — Z0000 Encounter for general adult medical examination without abnormal findings: Secondary | ICD-10-CM | POA: Insufficient documentation

## 2015-05-24 DIAGNOSIS — H1010 Acute atopic conjunctivitis, unspecified eye: Secondary | ICD-10-CM | POA: Insufficient documentation

## 2015-05-24 DIAGNOSIS — H1012 Acute atopic conjunctivitis, left eye: Secondary | ICD-10-CM | POA: Diagnosis not present

## 2015-05-24 LAB — CBC
HCT: 33.6 % — ABNORMAL LOW (ref 36.0–46.0)
Hemoglobin: 10.8 g/dL — ABNORMAL LOW (ref 12.0–15.0)
MCH: 22.7 pg — ABNORMAL LOW (ref 26.0–34.0)
MCHC: 32.1 g/dL (ref 30.0–36.0)
MCV: 70.7 fL — ABNORMAL LOW (ref 78.0–100.0)
MPV: 10.4 fL (ref 8.6–12.4)
PLATELETS: 334 10*3/uL (ref 150–400)
RBC: 4.75 MIL/uL (ref 3.87–5.11)
RDW: 16.1 % — AB (ref 11.5–15.5)
WBC: 5.8 10*3/uL (ref 4.0–10.5)

## 2015-05-24 LAB — LIPID PANEL
CHOLESTEROL: 141 mg/dL (ref 125–200)
HDL: 52 mg/dL (ref >=46)
LDL Cholesterol: 78 mg/dL (ref <130)
TRIGLYCERIDES: 57 mg/dL (ref <150)
Total CHOL/HDL Ratio: 2.7 Ratio (ref <=5.0)
VLDL: 11 mg/dL (ref <30)

## 2015-05-24 LAB — BASIC METABOLIC PANEL
BUN: 7 mg/dL (ref 7–25)
CALCIUM: 9.2 mg/dL (ref 8.6–10.2)
CO2: 23 mmol/L (ref 20–31)
Chloride: 104 mmol/L (ref 98–110)
Creat: 0.56 mg/dL (ref 0.50–1.10)
GLUCOSE: 78 mg/dL (ref 65–99)
Potassium: 4.3 mmol/L (ref 3.5–5.3)
SODIUM: 136 mmol/L (ref 135–146)

## 2015-05-24 LAB — TSH: TSH: 2.65 mIU/L

## 2015-05-24 MED ORDER — CROMOLYN SODIUM 4 % OP SOLN: 1.0000 [drp] | Freq: Four times a day (QID) | OPHTHALMIC | Status: DC

## 2015-05-24 NOTE — Assessment & Plan Note (Addendum)
Watery left eye x  3 weeks, start topical anti allergy drop for as needed use

## 2015-05-24 NOTE — Assessment & Plan Note (Signed)
Pt education done re healthy diet to adfdress constipation issues, also advised daily stool softener until BM become regular

## 2015-05-24 NOTE — Progress Notes (Signed)
   Subjective:    Patient ID: Elizabeth Gardner, female    DOB: 1986/03/07, 29 y.o.   MRN: [id number]  HPI Patient is in for annual physical exam. Immunization is reviewed . C/o watery left eye for weeks, no pain , redness or loss of vision C/o constipation, goes every 3 to 4 days and relies on l;xative, stool is hard. States she is eating a lot of restaurant fast food/ pizza etc, little vegetables , knows diet is a problem and is concerned about weight gain, will change eating and use softeners is needed Review of Systems See HPI      Objective:   Physical Exam BP 112/78 mmHg  Pulse 78  Resp 16  Ht 5\' 5"  (1.651 m)  Wt 144 lb (65.318 kg)  BMI 23.96 kg/m2  SpO2 98%  Pleasant well nourished female, alert and oriented x 3, in no cardio-pulmonary distress. Afebrile. HEENT No facial trauma or asymetry. Sinuses non tender.  Extra occullar muscles intact, pupils equally reactive to light.excess watering of left eye, mild conjunctival erythema External ears normal, tympanic membranes clear. Oropharynx moist, no exudate, good dentition. Neck: supple, no adenopathy,JVD or thyromegaly.No bruits.  Chest: Clear to ascultation bilaterally.No crackles or wheezes. Non tender to palpation  Breast: No asymetry,no masses or lumps. No tenderness. No nipple discharge or inversion. No axillary or supraclavicular adenopathy  Cardiovascular system; Heart sounds normal,  S1 and  S2 ,no S3.  No murmur, or thrill. Apical beat not displaced Peripheral pulses normal.  Abdomen: Soft, non tender, no organomegaly or masses. No bruits. Bowel sounds normal. No guarding, tenderness or rebound.  GU: External genitalia normal female genitalia , female distribution of hair. No lesions. Urethral meatus normal in size, no  Prolapse, no lesions visibly  Present. Bladder non tender. Vagina pink and moist , with no visible lesions , discharge present . Adequate pelvic support no  cystocele or rectocele  noted Cervix pink and appears healthy, no lesions or ulcerations noted, no discharge noted from os Uterus normal size, no adnexal masses, no cervical motion or adnexal tenderness.   Musculoskeletal exam: Full ROM of spine, hips , shoulders and knees. No deformity ,swelling or crepitus noted. No muscle wasting or atrophy.   Neurologic: Cranial nerves 2 to 12 intact. Power, tone ,sensation and reflexes normal throughout. No disturbance in gait. No tremor.  Skin: Intact, no ulceration, erythema , scaling or rash noted. Pigmentation normal throughout  Psych; Normal mood and affect. Judgement and concentration normal        Assessment & Plan:  Annual physical exam Annual exam as documented. Counseling done  re healthy lifestyle involving commitment to 150 minutes exercise per week, heart healthy diet, and attaining healthy weight.The importance of adequate sleep also discussed. Regular seat belt use and home safety, is also discussed. Changes in health habits are decided on by the patient with goals and time frames  set for achieving them. Immunization and cancer screening needs are specifically addressed at this visit.   Allergic conjunctivitis Watery left eye x  3 weeks, start topical anti allergy drop for as needed use  Constipation Pt education done re healthy diet to adfdress constipation issues, also advised daily stool softener until BM become regular

## 2015-05-24 NOTE — Patient Instructions (Signed)
cPE in 12 month, call if you need me before  Labs today  Allergy eye drop and 12 months of contraceptive pills at your pharmacy  Please change eating habits as we discussed, will help both constipation and overall health, you can do this!  Thanks for choosing [facility], we consider it a privelige to serve you.

## 2015-05-24 NOTE — Assessment & Plan Note (Signed)

## 2015-05-25 LAB — VITAMIN D 25 HYDROXY (VIT D DEFICIENCY, FRACTURES): Vit D, 25-Hydroxy: 10 ng/mL — ABNORMAL LOW (ref 30–100)

## 2015-05-27 LAB — IRON: IRON: 100 ug/dL (ref 40–190)

## 2015-05-27 LAB — FERRITIN: FERRITIN: 20 ng/mL (ref 10–154)

## 2015-05-28 LAB — CYTOLOGY - PAP

## 2015-05-28 LAB — CERVICOVAGINAL ANCILLARY ONLY: Wet Prep (BD Affirm): NEGATIVE

## 2015-05-31 ENCOUNTER — Other Ambulatory Visit: Payer: Self-pay

## 2015-05-31 MED ORDER — VITAMIN D (ERGOCALCIFEROL) 1.25 MG (50000 UNIT) PO CAPS: 50000.0000 [IU] | ORAL_CAPSULE | ORAL | Status: DC

## 2015-06-12 ENCOUNTER — Encounter: Payer: Self-pay | Admitting: Family Medicine

## 2015-06-12 ENCOUNTER — Ambulatory Visit (HOSPITAL_COMMUNITY)
Admission: RE | Admit: 2015-06-12 | Discharge: 2015-06-12 | Disposition: A | Discharge status: Home / Self Care | Payer: BLUE CROSS/BLUE SHIELD | Source: Ambulatory Visit | Attending: Family Medicine | Admitting: Family Medicine

## 2015-06-12 ENCOUNTER — Ambulatory Visit (INDEPENDENT_AMBULATORY_CARE_PROVIDER_SITE_OTHER): Payer: BLUE CROSS/BLUE SHIELD | Admitting: Family Medicine

## 2015-06-12 VITALS — BP 108/78 | HR 99 | Resp 16 | Ht 64.0 in | Wt 143.4 lb

## 2015-06-12 DIAGNOSIS — M419 Scoliosis, unspecified: Secondary | ICD-10-CM | POA: Diagnosis not present

## 2015-06-12 DIAGNOSIS — M5441 Lumbago with sciatica, right side: Secondary | ICD-10-CM

## 2015-06-12 DIAGNOSIS — M546 Pain in thoracic spine: Secondary | ICD-10-CM | POA: Diagnosis not present

## 2015-06-12 DIAGNOSIS — M4326 Fusion of spine, lumbar region: Secondary | ICD-10-CM | POA: Diagnosis not present

## 2015-06-12 DIAGNOSIS — J3089 Other allergic rhinitis: Secondary | ICD-10-CM

## 2015-06-12 DIAGNOSIS — R05 Cough: Secondary | ICD-10-CM | POA: Diagnosis not present

## 2015-06-12 DIAGNOSIS — R058 Other specified cough: Secondary | ICD-10-CM

## 2015-06-12 DIAGNOSIS — M549 Dorsalgia, unspecified: Secondary | ICD-10-CM | POA: Insufficient documentation

## 2015-06-12 MED ORDER — MONTELUKAST SODIUM 10 MG PO TABS: 10.0000 mg | ORAL_TABLET | Freq: Every day | ORAL | Status: DC

## 2015-06-12 MED ORDER — IBUPROFEN 800 MG PO TABS: 800.0000 mg | ORAL_TABLET | Freq: Three times a day (TID) | ORAL | Status: DC | PRN

## 2015-06-12 MED ORDER — PREDNISONE 5 MG (21) PO TBPK: 5.0000 mg | ORAL_TABLET | ORAL | Status: DC

## 2015-06-12 NOTE — Progress Notes (Signed)
   Subjective:    Patient ID: Elizabeth Gardner, female    DOB: 1985-08-13, 30 y.o.   MRN: [id number]  HPI 5 day h/o burning pain in both lower legs down to toes, right worse than left , no weakness, numbness or incontinence. No inciting trauma, no back pain 3 days ago excessive cough and difficulty breathing with right posterior chest pain , aggravated by RUE movement  Denies fever, chills or sputum Review of Systems See HPI Denies recent fever or chills. Denies sinus pressure, nasal congestion, ear pain or sore throat. Denies chest congestion, productive cough or wheezing. Denies chest pains, palpitations and leg swelling Denies abdominal pain, nausea, vomiting,diarrhea or constipation.   Denies dysuria, frequency, hesitancy or incontinence. Denies depression, anxiety or insomnia. Denies skin break down or rash.        Objective:   Physical Exam BP 108/78 mmHg  Pulse 99  Resp 16  Ht 5\' 4"  (1.626 m)  Wt 143 lb 6.4 oz (65.046 kg)  BMI 24.60 kg/m2  SpO2 100%  LMP 05/29/2015 Patient alert and oriented and in no cardiopulmonary distress.  HEENT: No facial asymmetry, EOMI,   oropharynx pink and moist.  Neck supple no JVD, no mass.  Chest: Clear to auscultation bilaterally.  CVS: S1, S2 no murmurs, no S3.Regular rate.  ABD: Soft non tender.   Ext: No edema  MS: Adequate ROM spine, shoulders, hips and knees.  Skin: Intact, no ulcerations or rash noted.  Psych: Good eye contact, normal affect. Memory intact not anxious or depressed appearing.  CNS: CN 2-12 intact, power,  normal throughout.no focal deficits noted.       Assessment & Plan:  Back pain H/o scoliosis with rods. Acute lower ext numbness suggestive of nerve irritation, anti inflammatory courde to be followed by long term gabapentin, if needed X ray today of back  Pain in thoracic spine X ray of thoracic spine today  Allergic cough incrased and uncontrolled with severe allergy symptoms, start daily  singulair, avoid allergens as possible  Allergic rhinitis Uncontrolled start singulair

## 2015-06-12 NOTE — Patient Instructions (Signed)
F/u as before, call if you need me sooner Pain is due to pinched nerves, in upper and lower back  X rays today.  Medications (2 ) sent for pain  For cough and uncontrolled allergies, singulair is sent  Thank you  for choosing [facility]. We consider it a privelige to serve you.  Delivering excellent health care in a caring and  compassionate way is our goal.  Partnering with you,  so that together we can achieve this goal is our strategy.

## 2015-06-18 NOTE — Assessment & Plan Note (Signed)
Uncontrolled start singulair

## 2015-06-18 NOTE — Assessment & Plan Note (Signed)
incrased and uncontrolled with severe allergy symptoms, start daily singulair, avoid allergens as possible

## 2015-06-18 NOTE — Assessment & Plan Note (Signed)
X ray of thoracic spine today

## 2015-06-18 NOTE — Assessment & Plan Note (Signed)
H/o scoliosis with rods. Acute lower ext numbness suggestive of nerve irritation, anti inflammatory courde to be followed by long term gabapentin, if needed X ray today of back

## 2015-12-31 ENCOUNTER — Telehealth: Payer: Self-pay | Admitting: Family Medicine

## 2015-12-31 NOTE — Telephone Encounter (Signed)
Immunization summary available for collection

## 2015-12-31 NOTE — Telephone Encounter (Signed)
Pt needs immunizations printed she is coming Wednesday morning to pick them up.

## 2016-02-13 ENCOUNTER — Other Ambulatory Visit: Payer: Self-pay | Admitting: Family Medicine

## 2016-03-30 ENCOUNTER — Encounter: Payer: Self-pay | Admitting: Family Medicine

## 2016-03-30 ENCOUNTER — Ambulatory Visit (INDEPENDENT_AMBULATORY_CARE_PROVIDER_SITE_OTHER): Payer: 59 | Admitting: Family Medicine

## 2016-03-30 VITALS — BP 100/62 | HR 64 | Temp 98.4°F | Resp 18 | Ht 64.0 in | Wt 137.1 lb

## 2016-03-30 DIAGNOSIS — M2142 Flat foot [pes planus] (acquired), left foot: Secondary | ICD-10-CM | POA: Diagnosis not present

## 2016-03-30 DIAGNOSIS — M2140 Flat foot [pes planus] (acquired), unspecified foot: Secondary | ICD-10-CM | POA: Insufficient documentation

## 2016-03-30 DIAGNOSIS — M2141 Flat foot [pes planus] (acquired), right foot: Secondary | ICD-10-CM

## 2016-03-30 NOTE — Progress Notes (Signed)
    Chief Complaint  Patient presents with  . Mass    left foot x 8 months   Here for acute visit Slowly growing "bump" on the left foot No pain or trauma Has not treated with cream or pad   Patient Active Problem List   Diagnosis Date Noted  . Pes planus 03/30/2016  . Back pain 06/12/2015  . Pain in thoracic spine 06/12/2015  . Allergic cough 06/12/2015  . Annual physical exam 05/24/2015  . Allergic conjunctivitis 05/24/2015  . Constipation 05/24/2015  . Contraceptive management 04/22/2015  . Anemia 12/03/2011  . Allergic rhinitis 12/03/2011    Outpatient Encounter Prescriptions as of 03/30/2016  Medication Sig  . ibuprofen (ADVIL,MOTRIN) 800 MG tablet Take 1 tablet (800 mg total) by mouth every 8 (eight) hours as needed.  Bertram Millard LO 0.18/0.215/0.25 MG-25 MCG tab TAKE 1 TABLET BY MOUTH EVERY DAY   Facility-Administered Encounter Medications as of 03/30/2016  Medication  . Influenza (>/= 3 years) inactive virus vaccine (FLVIRIN/FLUZONE) injection SUSP 0.5 mL    No Known Allergies  Review of Systems  Constitutional: Negative for activity change and unexpected weight change.  Musculoskeletal: Negative for arthralgias and gait problem.  Skin: Negative for color change, rash and wound.  All other systems reviewed and are negative.   BP 100/62 (BP Location: Right Arm, Patient Position: Sitting, Cuff Size: Normal)   Pulse 64   Temp 98.4 F (36.9 C) (Oral)   Resp 18   Ht 5\' 4"  (1.626 m)   Wt 137 lb 1.3 oz (62.2 kg)   LMP 02/25/2016 (Exact Date)   SpO2 100%   BMI 23.53 kg/m   Physical Exam  Constitutional: She is oriented to person, place, and time. She appears well-developed and well-nourished. No distress.  HENT:  Head: Normocephalic and atraumatic.  Mouth/Throat: Oropharynx is clear and moist.  Eyes: Conjunctivae are normal. Pupils are equal, round, and reactive to light.  Musculoskeletal:       Left foot: There is deformity.       Feet:  Neurological:  She is alert and oriented to person, place, and time.  Psychiatric: She has a normal mood and affect. Her behavior is normal. Thought content normal.    ASSESSMENT/PLAN:  1. Pes planus of both feet  - Ambulatory referral to Podiatry   Patient Instructions  Need to see a podiatrist to see about shoe inserts to correct the flat feet   Raylene Everts, MD

## 2016-03-30 NOTE — Patient Instructions (Signed)
Need to see a podiatrist to see about shoe inserts to correct the flat feet

## 2016-04-06 DIAGNOSIS — L851 Acquired keratosis [keratoderma] palmaris et plantaris: Secondary | ICD-10-CM | POA: Diagnosis not present

## 2016-04-06 DIAGNOSIS — M799 Soft tissue disorder, unspecified: Secondary | ICD-10-CM | POA: Diagnosis not present

## 2016-04-06 DIAGNOSIS — M79673 Pain in unspecified foot: Secondary | ICD-10-CM | POA: Diagnosis not present

## 2016-05-26 ENCOUNTER — Other Ambulatory Visit (HOSPITAL_COMMUNITY)
Admission: RE | Admit: 2016-05-26 | Discharge: 2016-05-26 | Disposition: A | Discharge status: Home / Self Care | Payer: 59 | Source: Ambulatory Visit | Attending: Family Medicine | Admitting: Family Medicine

## 2016-05-26 ENCOUNTER — Encounter: Payer: Self-pay | Admitting: Family Medicine

## 2016-05-26 ENCOUNTER — Ambulatory Visit (INDEPENDENT_AMBULATORY_CARE_PROVIDER_SITE_OTHER): Payer: 59 | Admitting: Family Medicine

## 2016-05-26 VITALS — BP 104/68 | HR 83 | Resp 15 | Ht 64.0 in | Wt 137.0 lb

## 2016-05-26 DIAGNOSIS — R1013 Epigastric pain: Secondary | ICD-10-CM

## 2016-05-26 DIAGNOSIS — K219 Gastro-esophageal reflux disease without esophagitis: Secondary | ICD-10-CM | POA: Diagnosis not present

## 2016-05-26 DIAGNOSIS — N760 Acute vaginitis: Secondary | ICD-10-CM | POA: Diagnosis not present

## 2016-05-26 DIAGNOSIS — D649 Anemia, unspecified: Secondary | ICD-10-CM

## 2016-05-26 DIAGNOSIS — L989 Disorder of the skin and subcutaneous tissue, unspecified: Secondary | ICD-10-CM

## 2016-05-26 DIAGNOSIS — Z0000 Encounter for general adult medical examination without abnormal findings: Secondary | ICD-10-CM | POA: Insufficient documentation

## 2016-05-26 DIAGNOSIS — K5909 Other constipation: Secondary | ICD-10-CM | POA: Diagnosis not present

## 2016-05-26 HISTORY — DX: Gastro-esophageal reflux disease without esophagitis: K21.9

## 2016-05-26 LAB — CBC
HEMATOCRIT: 34.2 % — AB (ref 35.0–45.0)
Hemoglobin: 10.5 g/dL — ABNORMAL LOW (ref 11.7–15.5)
MCH: 22.2 pg — ABNORMAL LOW (ref 27.0–33.0)
MCHC: 30.7 g/dL — AB (ref 32.0–36.0)
MCV: 72.5 fL — ABNORMAL LOW (ref 80.0–100.0)
MPV: 10.8 fL (ref 7.5–12.5)
PLATELETS: 332 10*3/uL (ref 140–400)
RBC: 4.72 MIL/uL (ref 3.80–5.10)
RDW: 16.2 % — ABNORMAL HIGH (ref 11.0–15.0)
WBC: 6.1 10*3/uL (ref 3.8–10.8)

## 2016-05-26 LAB — LIPID PANEL
Cholesterol: 127 mg/dL (ref <200)
HDL: 47 mg/dL — ABNORMAL LOW (ref >50)
LDL CALC: 69 mg/dL (ref <100)
TRIGLYCERIDES: 55 mg/dL (ref <150)
Total CHOL/HDL Ratio: 2.7 Ratio (ref <5.0)
VLDL: 11 mg/dL (ref <30)

## 2016-05-26 LAB — IRON AND TIBC
%SAT: 24 % (ref 11–50)
IRON: 100 ug/dL (ref 40–190)
TIBC: 412 ug/dL (ref 250–450)
UIBC: 312 ug/dL (ref 125–400)

## 2016-05-26 LAB — COMPREHENSIVE METABOLIC PANEL
ALT: 8 U/L (ref 6–29)
AST: 14 U/L (ref 10–30)
Albumin: 3.9 g/dL (ref 3.6–5.1)
Alkaline Phosphatase: 56 U/L (ref 33–115)
BUN: 9 mg/dL (ref 7–25)
CALCIUM: 9 mg/dL (ref 8.6–10.2)
CHLORIDE: 106 mmol/L (ref 98–110)
CO2: 26 mmol/L (ref 20–31)
Creat: 0.66 mg/dL (ref 0.50–1.10)
GLUCOSE: 73 mg/dL (ref 65–99)
POTASSIUM: 4.4 mmol/L (ref 3.5–5.3)
Sodium: 138 mmol/L (ref 135–146)
Total Bilirubin: 0.4 mg/dL (ref 0.2–1.2)
Total Protein: 6.7 g/dL (ref 6.1–8.1)

## 2016-05-26 LAB — VITAMIN B12: VITAMIN B 12: 302 pg/mL (ref 200–1100)

## 2016-05-26 LAB — FERRITIN: FERRITIN: 20 ng/mL (ref 10–154)

## 2016-05-26 LAB — FOLATE: Folate: 10.1 ng/mL (ref >5.4)

## 2016-05-26 LAB — TSH: TSH: 3.02 mIU/L

## 2016-05-26 MED ORDER — POLYETHYLENE GLYCOL 3350 17 GM/SCOOP PO POWD: ORAL | 3 refills | Status: DC

## 2016-05-26 MED FILL — POLYETHYLENE GLYCOL 3350: 45 days supply | Qty: 765 | Fill #0

## 2016-05-26 NOTE — Assessment & Plan Note (Signed)
c/o discharge and wants to be checked for "everything"

## 2016-05-26 NOTE — Patient Instructions (Addendum)
f/u in 3 months, call if you need me sooner  Breath test, CBC, fasting lipid, cmp TSH and vit D today and anemia panel today   Medication will be sent for reflux and for constipation to [facility] pharmacy , also your birth control pills  You are being referred to podiatry because of cyst/callus on left foot which is painful  It is important that you exercise regularly at least 30 minutes 5 times a week. If you develop chest pain, have severe difficulty breathing, or feel very tired, stop exercising immediately and seek medical attention  Thank you  for choosing [facility]. We consider it a privelige to serve you.  Delivering excellent health care in a caring and  compassionate way is our goal.  Partnering with you,  so that together we can achieve this goal is our strategy.

## 2016-05-26 NOTE — Progress Notes (Signed)
    Elizabeth Gardner     MRN: [id number]      DOB: November 25, 1985  HPI: Patient is in for annual physical exam. c/o painful left foot lesion slow growing c/o hoarseness and heartburn c/o vaginal d/c wants testing Immunization is reviewed , and  updated if needed.   PE: BP 104/68   Pulse 83   Resp 15   Ht 5\' 4"  (1.626 m)   Wt 137 lb (62.1 kg)   LMP 05/08/2016 (Approximate)   SpO2 99%   BMI 23.52 kg/m   Pleasant  female, alert and oriented x 3, in no cardio-pulmonary distress. Afebrile. HEENT No facial trauma or asymetry. Sinuses non tender.  Extra occullar muscles intact, pupils equally reactive to light. External ears normal, tympanic membranes clear. Oropharynx moist, no exudate. Neck: supple, no adenopathy,JVD or thyromegaly.No bruits.  Chest: Clear to ascultation bilaterally.No crackles or wheezes. Non tender to palpation  Breast: No asymetry,no masses or lumps. No tenderness. No nipple discharge or inversion. No axillary or supraclavicular adenopathy  Cardiovascular system; Heart sounds normal,  S1 and  S2 ,no S3.  No murmur, or thrill. Apical beat not displaced Peripheral pulses normal.  Abdomen: Soft, non tender, no organomegaly or masses. No bruits. Bowel sounds normal. No guarding, tenderness or rebound.    GU: External genitalia normal female genitalia , normal female distribution of hair. No lesions. Urethral meatus normal in size, no  Prolapse, no lesions visibly  Present. Bladder non tender. Vagina pink and moist , with no visible lesions ,  White discharge present . Adequate pelvic support no  cystocele or rectocele noted Cervix pink and appears healthy, no lesions or ulcerations noted, no discharge noted from os Uterus normal size, no adnexal masses, no cervical motion or adnexal tenderness.   Musculoskeletal exam: Full ROM of spine, hips , shoulders and knees. No deformity ,swelling or crepitus noted. No muscle wasting or atrophy.    Protuberant cystic lesion on lateral aspect of left foot, max diameter approx 2 cm tender to palpation Neurologic: Cranial nerves 2 to 12 intact. Power, tone ,sensation and reflexes normal throughout. No disturbance in gait. No tremor.  Skin: Intact, no ulceration, erythema , scaling or rash noted. Pigmentation normal throughout  Psych; Normal mood and affect. Judgement and concentration normal   Assessment & Plan:  Annual physical exam Annual exam as documented. Immunization and cancer screening needs are specifically addressed at this visit.   Constipation Commit to daily miralax, increase vegetable, fruit and water  GERD (gastroesophageal reflux disease) Symptomatic with chronic hoorseness and  heartburn symptoms Needs breath test and definitive treatment No caffeine  Vulvovaginitis c/o discharge and wants to be checked for "everything"  Foot lesion Painful slow growing left foot lesion, will refer for 2nd podiatry opinion

## 2016-05-26 NOTE — Assessment & Plan Note (Signed)
Commit to daily miralax, increase vegetable, fruit and water

## 2016-05-26 NOTE — Assessment & Plan Note (Signed)
Painful slow growing left foot lesion, will refer for 2nd podiatry opinion

## 2016-05-26 NOTE — Assessment & Plan Note (Signed)
Annual exam as documented. . Immunization and cancer screening needs are specifically addressed at this visit.  

## 2016-05-26 NOTE — Assessment & Plan Note (Signed)
Symptomatic with chronic hoorseness and  heartburn symptoms Needs breath test and definitive treatment No caffeine

## 2016-05-27 ENCOUNTER — Encounter: Payer: Self-pay | Admitting: Family Medicine

## 2016-05-27 ENCOUNTER — Other Ambulatory Visit: Payer: Self-pay | Admitting: Family Medicine

## 2016-05-27 LAB — CERVICOVAGINAL ANCILLARY ONLY
CHLAMYDIA, DNA PROBE: NEGATIVE
NEISSERIA GONORRHEA: NEGATIVE
Wet Prep (BD Affirm): POSITIVE — AB

## 2016-05-27 LAB — VITAMIN D 25 HYDROXY (VIT D DEFICIENCY, FRACTURES): VIT D 25 HYDROXY: 13 ng/mL — AB (ref 30–100)

## 2016-05-29 ENCOUNTER — Other Ambulatory Visit: Payer: Self-pay

## 2016-05-29 MED ORDER — ERGOCALCIFEROL 1.25 MG (50000 UT) PO CAPS: 50000.0000 [IU] | ORAL_CAPSULE | ORAL | 1 refills | Status: DC

## 2016-05-29 MED ORDER — METRONIDAZOLE 500 MG PO TABS: 500.0000 mg | ORAL_TABLET | Freq: Two times a day (BID) | ORAL | 0 refills | Status: DC

## 2016-06-09 DIAGNOSIS — R1013 Epigastric pain: Secondary | ICD-10-CM | POA: Diagnosis not present

## 2016-06-10 ENCOUNTER — Other Ambulatory Visit: Payer: Self-pay | Admitting: Family Medicine

## 2016-06-10 LAB — H. PYLORI BREATH TEST: H. PYLORI BREATH TEST: DETECTED — AB

## 2016-06-10 MED ORDER — LANSOPRAZOLE 30 MG PO CPDR: 30.0000 mg | DELAYED_RELEASE_CAPSULE | Freq: Every day | ORAL | 0 refills | Status: DC

## 2016-06-10 MED ORDER — AMOXICILL-CLARITHRO-LANSOPRAZ PO MISC: Freq: Two times a day (BID) | ORAL | 0 refills | Status: DC

## 2016-06-11 ENCOUNTER — Other Ambulatory Visit: Payer: Self-pay | Admitting: Family Medicine

## 2016-06-11 ENCOUNTER — Telehealth: Payer: Self-pay

## 2016-06-11 ENCOUNTER — Other Ambulatory Visit: Payer: Self-pay

## 2016-06-11 MED ORDER — AMOXICILLIN 500 MG PO CAPS
ORAL_CAPSULE | ORAL | 0 refills | Status: AC
Start: 2016-06-11 — End: 2016-06-25

## 2016-06-11 MED ORDER — CLARITHROMYCIN 500 MG PO TABS: 500.0000 mg | ORAL_TABLET | Freq: Two times a day (BID) | ORAL | 0 refills | Status: AC

## 2016-06-11 MED ORDER — LANSOPRAZOLE 30 MG PO CPDR: 30.0000 mg | DELAYED_RELEASE_CAPSULE | Freq: Two times a day (BID) | ORAL | 0 refills | Status: DC

## 2016-06-11 MED ORDER — AMOXICILLIN 500 MG PO CAPS: ORAL_CAPSULE | ORAL | 0 refills | Status: DC

## 2016-06-11 MED FILL — CLARITHROMYCIN 500 MG TAB: 500 | 14 days supply | Qty: 28 | Fill #0

## 2016-06-11 MED FILL — AMOXICILLIN 500 MG CAPSULE: 500 | 14 days supply | Qty: 56 | Fill #0

## 2016-06-11 MED FILL — LANSOPRAZOLE DR 30 MG CAP: 30 | 14 days supply | Qty: 28 | Fill #0

## 2016-06-11 NOTE — Telephone Encounter (Signed)
biaxin printed , prevacid and amoxicillin sent in error to [facility] p[ls fax to [facility] d/c [facility] order

## 2016-06-11 NOTE — Telephone Encounter (Signed)
Done

## 2016-06-11 NOTE — Telephone Encounter (Signed)
Insurance doesn't cover prevpac. Can you let me know the directions for splitting it up? Thanks

## 2016-08-17 ENCOUNTER — Telehealth: Payer: Self-pay | Admitting: *Deleted

## 2016-08-17 NOTE — Telephone Encounter (Signed)
Pt left name and phone number. I spoke with pt, she asked  " Will they take this thang off my foot." I reviewed clinicals and our doctors had not seen pt in office. I asked pt what was to be taken off and she said a cyst like thing. I told her that Dr. Paulla Dolly would evaluate the cyst, size and location as well as her general health prior to any surgical removal. Pt states understanding.

## 2016-08-18 ENCOUNTER — Telehealth: Payer: Self-pay | Admitting: Family Medicine

## 2016-08-18 NOTE — Telephone Encounter (Signed)
Patient called requesting an appt " eye is pouring water, swollen, and burning"  She doesn't have an eye doctor. Cb#: [phone]

## 2016-08-19 NOTE — Telephone Encounter (Signed)
Left eye draining and burning and she used some of her daughters eyedrops and its feeling much better

## 2016-08-20 ENCOUNTER — Encounter: Payer: Self-pay | Admitting: Podiatry

## 2016-08-20 ENCOUNTER — Ambulatory Visit (INDEPENDENT_AMBULATORY_CARE_PROVIDER_SITE_OTHER): Payer: 59 | Admitting: Podiatry

## 2016-08-20 ENCOUNTER — Ambulatory Visit: Payer: 59

## 2016-08-20 ENCOUNTER — Ambulatory Visit (INDEPENDENT_AMBULATORY_CARE_PROVIDER_SITE_OTHER): Payer: 59

## 2016-08-20 VITALS — BP 117/66 | HR 84 | Resp 16 | Ht 63.0 in | Wt 140.0 lb

## 2016-08-20 DIAGNOSIS — M79672 Pain in left foot: Secondary | ICD-10-CM | POA: Diagnosis not present

## 2016-08-20 DIAGNOSIS — D492 Neoplasm of unspecified behavior of bone, soft tissue, and skin: Secondary | ICD-10-CM

## 2016-08-20 NOTE — Progress Notes (Signed)
Subjective:    Patient ID: Elizabeth Gardner, female   DOB: 31 y.o.   MRN: [id number]   HPI patient states that she has a lesion on the left medial foot that's become increasingly painful large and it's difficult for her to wear shoes. She states she's tried to pad and other modalities without relief and she wants it removed    Review of Systems  All other systems reviewed and are negative.       Objective:  Physical Exam  Cardiovascular: Intact distal pulses.   Musculoskeletal: Normal range of motion.  Neurological: She is alert.  Skin: Skin is warm.  Nursing note and vitals reviewed.  neurovascular status intact muscle strength adequate range of motion within normal limits with patient found to have a approximate 1 cm x 1 cm hard mass on the medial side of the left arch in a more dorsal direction. It is firm and it has been present for at least a year and she does not remember specific injury. 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 Discussed different treatment options she wants it fixed and I have recommended excision and explained that it could grow back with this. Patient wants surgery understanding risk and today I allowed her to read consent form going over alternative treatments complications and after extensive review she signed consent form understanding everything as outlined. Patient is scheduled for outpatient surgery in the next several weeks and was encouraged to call with any questions understands recovery can take 6 months to one year and there again is no guarantee as far as the overall success and this  X-rays indicate that there is no indications of calcification or spur formation

## 2016-08-20 NOTE — Progress Notes (Signed)
   Subjective:    Patient ID: Elizabeth Gardner, female    DOB: 01-10-1986, 31 y.o.   MRN: [id number]  HPI Chief Complaint  Patient presents with  . Cyst    Left foot; medial side; pt stated, "My doctor told me I had a cyst on foot and that it needs to come off; have had this since Summer 2017; it hurts and itches"      Review of Systems  Skin: Positive for color change.  All other systems reviewed and are negative.      Objective:   Physical Exam        Assessment & Plan:

## 2016-08-20 NOTE — Patient Instructions (Signed)
Pre-Operative Instructions  Congratulations, you have decided to take an important step to improving your quality of life.  You can be assured that the doctors of [facility] will be with you every step of the way.  1. Plan to be at the surgery center/hospital at least 1 (one) hour prior to your scheduled time unless otherwise directed by the surgical center/hospital staff.  You must have a responsible adult accompany you, remain during the surgery and drive you home.  Make sure you have directions to the surgical center/hospital and know how to get there on time. 2. For hospital based surgery you will need to obtain a history and physical form from your family physician within 1 month prior to the date of surgery- we will give you a form for you primary physician.  3. We make every effort to accommodate the date you request for surgery.  There are however, times where surgery dates or times have to be moved.  We will contact you as soon as possible if a change in schedule is required.   4. No Aspirin/Ibuprofen for one week before surgery.  If you are on aspirin, any non-steroidal anti-inflammatory medications (Mobic, Aleve, Ibuprofen) you should stop taking it 7 days prior to your surgery.  You make take Tylenol  For pain prior to surgery.  5. Medications- If you are taking daily heart and blood pressure medications, seizure, reflux, allergy, asthma, anxiety, pain or diabetes medications, make sure the surgery center/hospital is aware before the day of surgery so they may notify you which medications to take or avoid the day of surgery. 6. No food or drink after midnight the night before surgery unless directed otherwise by surgical center/hospital staff. 7. No alcoholic beverages 24 hours prior to surgery.  No smoking 24 hours prior to or 24 hours after surgery. 8. Wear loose pants or shorts- loose enough to fit over bandages, boots, and casts. 9. No slip on shoes, sneakers are best. 10. Bring  your boot with you to the surgery center/hospital.  Also bring crutches or a walker if your physician has prescribed it for you.  If you do not have this equipment, it will be provided for you after surgery. 11. If you have not been contracted by the surgery center/hospital by the day before your surgery, call to confirm the date and time of your surgery. 12. Leave-time from work may vary depending on the type of surgery you have.  Appropriate arrangements should be made prior to surgery with your employer. 13. Prescriptions will be provided immediately following surgery by your doctor.  Have these filled as soon as possible after surgery and take the medication as directed. 14. Remove nail polish on the operative foot. 15. Wash the night before surgery.  The night before surgery wash the foot and leg well with the antibacterial soap provided and water paying special attention to beneath the toenails and in between the toes.  Rinse thoroughly with water and dry well with a towel.  Perform this wash unless told not to do so by your physician.  Enclosed: 1 Ice pack (please put in freezer the night before surgery)   1 Hibiclens skin cleaner   Pre-op Instructions  If you have any questions regarding the instructions, do not hesitate to call our office.  [place]: [address] [place], [place] [address] [phone]  [place]: [address] [place], [place] [address] [phone]  [place]: [address]  [place], [place] [address] [phone]   Dr.   Chardonnay Holzmann DPM, Dr. Matthew Wagoner DPM, Dr. M. Todd Hyatt DPM, Dr. Titorya Stover DPM 

## 2016-08-23 DIAGNOSIS — H60332 Swimmer's ear, left ear: Secondary | ICD-10-CM | POA: Diagnosis not present

## 2016-08-23 DIAGNOSIS — H578 Other specified disorders of eye and adnexa: Secondary | ICD-10-CM | POA: Diagnosis not present

## 2016-08-25 ENCOUNTER — Telehealth: Payer: Self-pay | Admitting: *Deleted

## 2016-08-25 NOTE — Telephone Encounter (Signed)
Called and talked to pt, states she went to urgent care Sunday. And they told her to take the gtts for 7 days, wanted to make sure that was ok. Advised to follow their directions.

## 2016-08-25 NOTE — Telephone Encounter (Signed)
Patient called stating she is having left ear pain, and she went to the urgent care Saturday after work and they provided her ear drops. Patient states her ear has shooting pain and still hurting. Please contact patient [phone]

## 2016-09-07 ENCOUNTER — Ambulatory Visit: Payer: 59 | Admitting: Family Medicine

## 2016-09-14 ENCOUNTER — Telehealth: Payer: Self-pay | Admitting: *Deleted

## 2016-09-14 NOTE — Telephone Encounter (Signed)
"  Hi, I'm calling to see what date I'm scheduled to have surgery with Dr. Paulla Dolly.  I think it's the thirtieth of this month."  Yes, you are scheduled for July 30.  "What time will I need to be there?"  I can't give you a time.  Someone from the surgical center will give you a call the day before or the Friday before surgery date with the arrival time.  "Okay, thank you that's all I needed to know."

## 2016-09-18 ENCOUNTER — Telehealth: Payer: Self-pay | Admitting: *Deleted

## 2016-09-18 NOTE — Telephone Encounter (Signed)
I called the patient to verify that her surgery is scheduled for July 31.  She verified that surgery is scheduled for that date.

## 2016-09-29 ENCOUNTER — Telehealth: Payer: Self-pay | Admitting: *Deleted

## 2016-09-29 NOTE — Telephone Encounter (Signed)
"  Hey Miss Alma Friendly please give me a call back at [phone].  Thank you."

## 2016-09-30 ENCOUNTER — Telehealth: Payer: Self-pay | Admitting: Podiatry

## 2016-09-30 NOTE — Telephone Encounter (Signed)
Pt called because she is scheduled for surgery next week and wants to know if we have received any faxes for her FMLA/Short Term Disability.

## 2016-09-30 NOTE — Telephone Encounter (Signed)
I attempted to return her call.  I asked her to give me a call back.

## 2016-09-30 NOTE — Telephone Encounter (Signed)
"  I called you yesterday.  I'm scheduled for surgery next week.  I have some disability papers that I need to have filled out."  Let me transfer you to Marcie Bal, she completes disability forms.    Call was transferred to [place].

## 2016-10-01 ENCOUNTER — Ambulatory Visit: Payer: 59 | Admitting: Family Medicine

## 2016-10-06 ENCOUNTER — Encounter: Payer: Self-pay | Admitting: Podiatry

## 2016-10-06 ENCOUNTER — Ambulatory Visit: Payer: 59 | Admitting: Family Medicine

## 2016-10-06 DIAGNOSIS — M67472 Ganglion, left ankle and foot: Secondary | ICD-10-CM | POA: Diagnosis not present

## 2016-10-06 DIAGNOSIS — D2372 Other benign neoplasm of skin of left lower limb, including hip: Secondary | ICD-10-CM | POA: Diagnosis not present

## 2016-10-06 DIAGNOSIS — M25572 Pain in left ankle and joints of left foot: Secondary | ICD-10-CM | POA: Diagnosis not present

## 2016-10-06 DIAGNOSIS — D492 Neoplasm of unspecified behavior of bone, soft tissue, and skin: Secondary | ICD-10-CM | POA: Diagnosis not present

## 2016-10-06 MED FILL — HYDROCODON-APAP 10-325: 10-325 | 7 days supply | Qty: 20 | Fill #0

## 2016-10-13 ENCOUNTER — Ambulatory Visit (INDEPENDENT_AMBULATORY_CARE_PROVIDER_SITE_OTHER): Payer: 59 | Admitting: Family Medicine

## 2016-10-13 ENCOUNTER — Encounter: Payer: Self-pay | Admitting: Family Medicine

## 2016-10-13 ENCOUNTER — Encounter: Payer: Self-pay | Admitting: Podiatry

## 2016-10-13 VITALS — BP 120/70 | HR 64 | Temp 98.0°F | Resp 16 | Ht 63.0 in | Wt 135.2 lb

## 2016-10-13 DIAGNOSIS — E559 Vitamin D deficiency, unspecified: Secondary | ICD-10-CM

## 2016-10-13 DIAGNOSIS — L989 Disorder of the skin and subcutaneous tissue, unspecified: Secondary | ICD-10-CM | POA: Diagnosis not present

## 2016-10-13 DIAGNOSIS — D509 Iron deficiency anemia, unspecified: Secondary | ICD-10-CM

## 2016-10-13 DIAGNOSIS — D649 Anemia, unspecified: Secondary | ICD-10-CM | POA: Diagnosis not present

## 2016-10-13 NOTE — Patient Instructions (Signed)
F/u early January, call if you need me before  Be patient and let your foot heal  Start twice daily iron 325 mg (one twice daily OTC)  Continue once weekly vitamin D  Non fasting cBC, iron, ferritin and vit D 2nd week I December  Glad heartburn cured, please stop caffeine

## 2016-10-16 ENCOUNTER — Ambulatory Visit: Payer: 59

## 2016-10-16 ENCOUNTER — Ambulatory Visit (INDEPENDENT_AMBULATORY_CARE_PROVIDER_SITE_OTHER): Payer: 59 | Admitting: Podiatry

## 2016-10-16 DIAGNOSIS — D492 Neoplasm of unspecified behavior of bone, soft tissue, and skin: Secondary | ICD-10-CM

## 2016-10-16 NOTE — Progress Notes (Signed)
Subjective:    Patient ID: Elizabeth Gardner, female   DOB: 31 y.o.   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

## 2016-10-17 DIAGNOSIS — E559 Vitamin D deficiency, unspecified: Secondary | ICD-10-CM | POA: Insufficient documentation

## 2016-10-17 NOTE — Progress Notes (Signed)
   Elizabeth Gardner     MRN: [id number]      DOB: September 25, 1985   HPI Elizabeth Gardner is here for follow up and re-evaluation of chronic medical conditions, medication management and review of any available recent lab and radiology data.  Preventive health is updated, specifically  Cancer screening and Immunization.   Reports marked improvement in gERD symptom following treatment of H pylori Currently off work following foot surgery, she is bored and wants  To go back Recent labs are reviewed  ROS Denies recent fever or chills. Denies sinus pressure, nasal congestion, ear pain or sore throat. Denies chest congestion, productive cough or wheezing. Denies chest pains, palpitations and leg swelling Denies abdominal pain, nausea, vomiting,diarrhea or constipation.   Denies dysuria, frequency, hesitancy or incontinence.   PE  BP 120/70 (BP Location: Left Arm, Patient Position: Sitting, Cuff Size: Normal)   Pulse 64   Temp 98 F (36.7 C) (Other (Comment))   Resp 16   Ht 5\' 3"  (1.6 m)   Wt 135 lb 4 oz (61.3 kg)   LMP 09/12/2016 (Approximate)   SpO2 98%   BMI 23.96 kg/m   Patient alert and oriented and in no cardiopulmonary distress.  HEENT: No facial asymmetry, EOMI,   oropharynx pink and moist.  Neck supple no JVD, no mass.  Chest: Clear to auscultation bilaterally.  CVS: S1, S2 no murmurs, no S3.Regular rate.  ABD: Soft non tender.   Ext: No edema  MS: Adequate ROM spine, shoulders, hips and knees.  Skin: Intact, no ulcerations or rash noted.  Psych: Good eye contact, normal affect. Memory intact not anxious or depressed appearing.  CNS: CN 2-12 intact, power,  normal throughout.no focal deficits noted.   Assessment & Plan  IDA (iron deficiency anemia) Continue twice daily iron supplement , on OCP, bleeding is less heavy so anticipate improvement in anemia will rept in 5 months  Vitamin D deficiency Once weekly supplement x 6 months then once daily  Foot  lesion Recent;ly had surgical removal , currently off work and recuperating

## 2016-10-17 NOTE — Assessment & Plan Note (Signed)
Recent;ly had surgical removal , currently off work and recuperating

## 2016-10-17 NOTE — Assessment & Plan Note (Signed)
Once weekly supplement x 6 months then once daily

## 2016-10-17 NOTE — Assessment & Plan Note (Signed)
Continue twice daily iron supplement , on OCP, bleeding is less heavy so anticipate improvement in anemia will rept in 5 months

## 2016-10-22 ENCOUNTER — Telehealth: Payer: Self-pay | Admitting: *Deleted

## 2016-10-22 NOTE — Telephone Encounter (Signed)
"  Is there any way you can call me back?  Thank you, have a good day."

## 2016-10-22 NOTE — Telephone Encounter (Signed)
I'm returning your call.  How can I help you?  "I brought my disability forms there to have them filled out.  You were not there so they told me to just leave them at the front desk.  I don't know."  Yes, they were probably given to [facility].  She takes care of the disability.

## 2016-10-30 ENCOUNTER — Ambulatory Visit (INDEPENDENT_AMBULATORY_CARE_PROVIDER_SITE_OTHER): Payer: Self-pay | Admitting: Podiatry

## 2016-10-30 DIAGNOSIS — D492 Neoplasm of unspecified behavior of bone, soft tissue, and skin: Secondary | ICD-10-CM

## 2016-10-30 NOTE — Progress Notes (Signed)
Subjective:    Patient ID: Elizabeth Gardner, female   DOB: 31 y.o.   MRN: [id number]   HPI patient states she's doing very well    ROS      Objective:  Physical Exam neurovascular status intact with patient's foot healing very well with wound edges well coapted and good alignment of stitches     Assessment:   Doing well post excision of mass from the left medial foot      Plan:    Stitches removed wound edges coapted well Steri-Strips applied sterile dressing applied and reappoint 2 weeks to recheck or earlier if needed

## 2016-11-03 ENCOUNTER — Other Ambulatory Visit: Payer: Self-pay | Admitting: Family Medicine

## 2016-11-11 ENCOUNTER — Encounter: Payer: Self-pay | Admitting: Podiatry

## 2016-11-11 ENCOUNTER — Ambulatory Visit (INDEPENDENT_AMBULATORY_CARE_PROVIDER_SITE_OTHER): Payer: 59 | Admitting: Podiatry

## 2016-11-11 DIAGNOSIS — D492 Neoplasm of unspecified behavior of bone, soft tissue, and skin: Secondary | ICD-10-CM

## 2016-11-11 NOTE — Progress Notes (Signed)
Subjective:    Patient ID: Elizabeth Gardner, female   DOB: 31 y.o.   MRN: [id number]   HPI patient states my left foot is doing much better and I'm ready to go back to work    ROS      Objective:  Physical Exam neurovascular status intact with well-healed surgical site left     Assessment:  Doing well from surgery left       Plan:   Allow patient to return to normal shoe gear and patient is released to go back to work

## 2016-11-18 ENCOUNTER — Ambulatory Visit: Payer: 59 | Admitting: Podiatry

## 2017-01-19 ENCOUNTER — Encounter: Payer: Self-pay | Admitting: Family Medicine

## 2017-01-21 ENCOUNTER — Other Ambulatory Visit: Payer: Self-pay

## 2017-01-21 ENCOUNTER — Encounter: Payer: Self-pay | Admitting: Family Medicine

## 2017-01-21 MED ORDER — POLYETHYLENE GLYCOL 3350 17 GM/SCOOP PO POWD: 17.0000 g | Freq: Every day | ORAL | 3 refills | Status: AC

## 2017-01-29 ENCOUNTER — Other Ambulatory Visit: Payer: Self-pay | Admitting: Family Medicine

## 2017-03-16 ENCOUNTER — Encounter: Payer: Self-pay | Admitting: Family Medicine

## 2017-03-16 ENCOUNTER — Ambulatory Visit (INDEPENDENT_AMBULATORY_CARE_PROVIDER_SITE_OTHER): Payer: 59 | Admitting: Family Medicine

## 2017-03-16 ENCOUNTER — Other Ambulatory Visit (HOSPITAL_COMMUNITY)
Admission: RE | Admit: 2017-03-16 | Discharge: 2017-03-16 | Disposition: A | Discharge status: Home / Self Care | Payer: 59 | Source: Ambulatory Visit | Attending: Family Medicine | Admitting: Family Medicine

## 2017-03-16 VITALS — BP 104/80 | HR 86 | Resp 16 | Ht 63.0 in | Wt 130.1 lb

## 2017-03-16 DIAGNOSIS — N760 Acute vaginitis: Secondary | ICD-10-CM | POA: Diagnosis not present

## 2017-03-16 DIAGNOSIS — E559 Vitamin D deficiency, unspecified: Secondary | ICD-10-CM

## 2017-03-16 DIAGNOSIS — D509 Iron deficiency anemia, unspecified: Secondary | ICD-10-CM | POA: Diagnosis not present

## 2017-03-16 DIAGNOSIS — D649 Anemia, unspecified: Secondary | ICD-10-CM | POA: Diagnosis not present

## 2017-03-16 MED ORDER — NORGESTIM-ETH ESTRAD TRIPHASIC 0.18/0.215/0.25 MG-25 MCG PO TABS: 1.0000 | ORAL_TABLET | Freq: Every day | ORAL | 3 refills | Status: DC

## 2017-03-16 NOTE — Patient Instructions (Signed)
Annual physical exam and pap May 27, 2018, call if you need me sooner  Specimens sent today from office for STD testing, you need to use condoms for protection all the time   Labs today Vit D , cbc, iron and ferritin  Thank you  for choosing  Primary Care. We consider it a privelige to serve you.  Delivering excellent health care in a caring and  compassionate way is our goal.  Partnering with you,  so that together we can achieve this goal is our strategy.

## 2017-03-17 LAB — CBC
HEMATOCRIT: 35 % (ref 35.0–45.0)
Hemoglobin: 10.8 g/dL — ABNORMAL LOW (ref 11.7–15.5)
MCH: 22.1 pg — AB (ref 27.0–33.0)
MCHC: 30.9 g/dL — AB (ref 32.0–36.0)
MCV: 71.7 fL — AB (ref 80.0–100.0)
MPV: 11.6 fL (ref 7.5–12.5)
PLATELETS: 314 10*3/uL (ref 140–400)
RBC: 4.88 10*6/uL (ref 3.80–5.10)
RDW: 14.9 % (ref 11.0–15.0)
WBC: 7.3 10*3/uL (ref 3.8–10.8)

## 2017-03-17 LAB — IRON: Iron: 97 ug/dL (ref 40–190)

## 2017-03-17 LAB — FERRITIN: Ferritin: 61 ng/mL (ref 10–154)

## 2017-03-17 LAB — VITAMIN D 25 HYDROXY (VIT D DEFICIENCY, FRACTURES): VIT D 25 HYDROXY: 17 ng/mL — AB (ref 30–100)

## 2017-03-18 ENCOUNTER — Encounter: Payer: Self-pay | Admitting: Family Medicine

## 2017-03-18 ENCOUNTER — Other Ambulatory Visit: Payer: Self-pay | Admitting: Family Medicine

## 2017-03-18 LAB — CERVICOVAGINAL ANCILLARY ONLY
Chlamydia: NEGATIVE
Neisseria Gonorrhea: NEGATIVE
Wet Prep (BD Affirm): POSITIVE — AB

## 2017-03-18 MED ORDER — ERGOCALCIFEROL 1.25 MG (50000 UT) PO CAPS: 50000.0000 [IU] | ORAL_CAPSULE | ORAL | 1 refills | Status: DC

## 2017-03-18 MED ORDER — METRONIDAZOLE 500 MG PO TABS: 500.0000 mg | ORAL_TABLET | Freq: Two times a day (BID) | ORAL | 0 refills | Status: DC

## 2017-03-18 NOTE — Progress Notes (Signed)
Based on recent lab

## 2017-03-29 ENCOUNTER — Encounter: Payer: Self-pay | Admitting: Family Medicine

## 2017-03-29 NOTE — Progress Notes (Signed)
   Elizabeth Gardner     MRN: [id number]      DOB: 26-Sep-1985   HPI [name] is here for follow up  C/o malodorous vaginal discharge, does not use condom consistently and wants STD testing Also wants lab f/u on anemia and vit D levels ROS Denies recent fever or chills. Denies sinus pressure, nasal congestion, ear pain or sore throat. Denies chest congestion, productive cough or wheezing. Denies chest pains, palpitations and leg swelling Denies abdominal pain, nausea, vomiting,diarrhea or constipation.   Denies dysuria, frequency, hesitancy or incontinence. Denies joint pain, swelling and limitation in mobility. Denies headaches, seizures, numbness, or tingling. Denies depression, anxiety or insomnia. Denies skin break down or rash.   PE  BP 104/80   Pulse 86   Resp 16   Ht 5\' 3"  (1.6 m)   Wt 130 lb 1.9 oz (59 kg)   SpO2 98%   BMI 23.05 kg/m   Patient alert and oriented and in no cardiopulmonary distress.  HEENT: No facial asymmetry, EOMI,   oropharynx pink and moist.  Neck supple no JVD, no mass.  Chest: Clear to auscultation bilaterally.  CVS: S1, S2 no murmurs, no S3.Regular rate.  ABD: Soft non tender.  Pelvic: Uterius normal sized, fishy vagiiaal discharge, no cervical motion or adnexal tenderness. noulcers or lesions on external or internal genitalk  Ext: No edema  MS: Adequate ROM spine, shoulders, hips and knees.  Skin: Intact, no ulcerations or rash noted.  Psych: Good eye contact, normal affect. Memory intact not anxious or depressed appearing.  CNS: CN 2-12 intact, power,  normal throughout.no focal deficits noted.   Assessment & Plan  IDA (iron deficiency anemia) Repeat lab shows that she has anemia but is npot iron deficient  Vitamin D deficiency Updated lab shows ongoing deficiency, pt needs to commit to weekly vit D  Vulvovaginitis Regular condom use is discussed and specimens are sent for testing, treatment will be determined once result is  obtained

## 2017-03-29 NOTE — Assessment & Plan Note (Signed)
Regular condom use is discussed and specimens are sent for testing, treatment will be determined once result is obtained

## 2017-03-29 NOTE — Assessment & Plan Note (Signed)
Updated lab shows ongoing deficiency, pt needs to commit to weekly vit D

## 2017-03-29 NOTE — Assessment & Plan Note (Addendum)
Repeat lab shows that she has anemia but is npot iron deficient

## 2017-08-10 ENCOUNTER — Encounter: Payer: Self-pay | Admitting: Family Medicine

## 2017-08-10 ENCOUNTER — Other Ambulatory Visit: Payer: Self-pay | Admitting: Family Medicine

## 2017-08-10 MED ORDER — POLYETHYLENE GLYCOL 3350 17 GM/SCOOP PO POWD: ORAL | 1 refills | Status: DC

## 2017-08-10 NOTE — Progress Notes (Unsigned)
miralax

## 2017-10-11 ENCOUNTER — Encounter: Payer: Self-pay | Admitting: Family Medicine

## 2017-10-12 ENCOUNTER — Ambulatory Visit: Payer: 59 | Admitting: Family Medicine

## 2017-10-12 ENCOUNTER — Emergency Department (HOSPITAL_COMMUNITY)
Admission: EM | Admit: 2017-10-12 | Discharge: 2017-10-12 | Disposition: A | Discharge status: Home / Self Care | Payer: PRIVATE HEALTH INSURANCE | Attending: Emergency Medicine | Admitting: Emergency Medicine

## 2017-10-12 ENCOUNTER — Emergency Department (HOSPITAL_COMMUNITY): Payer: PRIVATE HEALTH INSURANCE

## 2017-10-12 ENCOUNTER — Encounter (HOSPITAL_COMMUNITY): Payer: Self-pay | Admitting: Emergency Medicine

## 2017-10-12 ENCOUNTER — Other Ambulatory Visit: Payer: Self-pay

## 2017-10-12 ENCOUNTER — Encounter: Payer: Self-pay | Admitting: Family Medicine

## 2017-10-12 DIAGNOSIS — Y929 Unspecified place or not applicable: Secondary | ICD-10-CM | POA: Diagnosis not present

## 2017-10-12 DIAGNOSIS — F0781 Postconcussional syndrome: Secondary | ICD-10-CM | POA: Insufficient documentation

## 2017-10-12 DIAGNOSIS — S0990XA Unspecified injury of head, initial encounter: Secondary | ICD-10-CM | POA: Insufficient documentation

## 2017-10-12 DIAGNOSIS — Y9389 Activity, other specified: Secondary | ICD-10-CM | POA: Insufficient documentation

## 2017-10-12 DIAGNOSIS — Y990 Civilian activity done for income or pay: Secondary | ICD-10-CM | POA: Insufficient documentation

## 2017-10-12 DIAGNOSIS — Z79899 Other long term (current) drug therapy: Secondary | ICD-10-CM | POA: Insufficient documentation

## 2017-10-12 DIAGNOSIS — W228XXA Striking against or struck by other objects, initial encounter: Secondary | ICD-10-CM | POA: Insufficient documentation

## 2017-10-12 DIAGNOSIS — R5381 Other malaise: Secondary | ICD-10-CM | POA: Diagnosis not present

## 2017-10-12 MED ORDER — TRAMADOL HCL 50 MG PO TABS: 50.0000 mg | ORAL_TABLET | Freq: Four times a day (QID) | ORAL | 0 refills | Status: DC | PRN

## 2017-10-12 NOTE — ED Notes (Signed)
Pt returned from CT °

## 2017-10-12 NOTE — Discharge Instructions (Addendum)
You may take over-the-counter ibuprofen 600 mg 3 times a day with food.  Follow-up with your primary doctor for recheck in 1 week if your symptoms are not improving.

## 2017-10-12 NOTE — ED Notes (Signed)
Pt with HA since her head was hit by a door being opened.  Pt denies nausea or vomiting.

## 2017-10-12 NOTE — ED Triage Notes (Signed)
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 Denies LOC.

## 2017-10-14 NOTE — ED Provider Notes (Signed)
[facility] EMERGENCY DEPARTMENT Provider Note   CSN: [id number] Arrival date & time: 10/12/17  1016     History   Chief Complaint Chief Complaint  Patient presents with  . Head Injury    HPI Elizabeth Gardner is a 32 y.o. female.  HPI  Elizabeth SIELOFF is a 32 y.o. female who presents to the Emergency Department complaining of work-related injury that occurred 3 days prior to arrival.  She states that she was struck to the left side of her head when someone kicked open a door causing it to strike the side of her head.  She reports persistent headache, intermittent nausea and generalized malaise since the injury.  Patient is employed here in [employer].  She denies neck pain, LOC, vomiting, visual changes, or dizziness.  She has tried taking over-the-counter medications without relief.  Nothing has made her symptoms better or worse.  Past Medical History:  Diagnosis Date  . General counseling for prescription of oral contraceptives   . Other general counseling and advice for contraceptive management     Patient Active Problem List   Diagnosis Date Noted  . Vitamin D deficiency 10/17/2016  . Foot lesion 05/26/2016  . GERD (gastroesophageal reflux disease) 05/26/2016  . Pes planus 03/30/2016  . Contraceptive management 04/22/2015  . Vulvovaginitis 12/05/2012  . IDA (iron deficiency anemia) 12/03/2011    Past Surgical History:  Procedure Laterality Date  . BACK SURGERY     for scoliosis      OB History   None      Home Medications    Prior to Admission medications   Medication Sig Start Date End Date Taking? Authorizing Provider  ergocalciferol (VITAMIN D2) 50000 units capsule Take 1 capsule (50,000 Units total) by mouth once a week. One capsule once weekly 03/18/17   Fayrene Helper, MD  ferrous sulfate 325 (65 FE) MG tablet Take 1 tablet (325 mg total) by mouth 2 (two) times daily with a meal. 10/13/16   Fayrene Helper, MD  metroNIDAZOLE (FLAGYL) 500 MG  tablet Take 1 tablet (500 mg total) by mouth 2 (two) times daily. 03/18/17   Fayrene Helper, MD  Norgestimate-Ethinyl Estradiol Triphasic (TRINESSA LO) 0.18/0.215/0.25 MG-25 MCG tab Take 1 tablet by mouth daily. 03/16/17   Fayrene Helper, MD  polyethylene glycol powder (GLYCOLAX/MIRALAX) powder 17 g in 8 ounces water daily as needed , for constipation 08/10/17   Fayrene Helper, MD  traMADol (ULTRAM) 50 MG tablet Take 1 tablet (50 mg total) by mouth every 6 (six) hours as needed. 10/12/17   Kem Parkinson, PA-C    Family History Family History  Problem Relation Age of Onset  . Hypertension Mother     Social History Social History   Tobacco Use  . Smoking status: Never Smoker  . Smokeless tobacco: Never Used  Substance Use Topics  . Alcohol use: No  . Drug use: No     Allergies   Patient has no known allergies.   Review of Systems Review of Systems  Constitutional: Positive for fatigue. Negative for activity change, appetite change and fever.  HENT: Negative for facial swelling and trouble swallowing.   Eyes: Negative for photophobia, pain and visual disturbance.  Respiratory: Negative for shortness of breath.   Cardiovascular: Negative for chest pain.  Gastrointestinal: Positive for nausea. Negative for abdominal pain and vomiting.  Musculoskeletal: Negative for neck pain and neck stiffness.  Skin: Negative for rash and wound.  Neurological: Positive for  headaches. Negative for dizziness, facial asymmetry, speech difficulty, weakness and numbness.  Psychiatric/Behavioral: Negative for confusion and decreased concentration.  All other systems reviewed and are negative.    Physical Exam Updated Vital Signs BP 113/82 (BP Location: Right Arm)   Pulse 68   Temp 98.8 F (37.1 C) (Oral)   Resp 14   Ht 5\' 3"  (1.6 m)   Wt 63.1 kg   LMP 10/11/2017   SpO2 100%   BMI 24.63 kg/m   Physical Exam  Constitutional: She is oriented to person, place, and time. She  appears well-developed and well-nourished. No distress.  HENT:  Head: Normocephalic and atraumatic.  Mouth/Throat: Oropharynx is clear and moist.  Eyes: Pupils are equal, round, and reactive to light. Conjunctivae and EOM are normal.  Neck: Normal range of motion and phonation normal. Neck supple. No spinous process tenderness and no muscular tenderness present. No neck rigidity. No Kernig's sign noted.  Cardiovascular: Normal rate, regular rhythm and intact distal pulses.  Pulmonary/Chest: Effort normal and breath sounds normal. No respiratory distress.  Musculoskeletal: Normal range of motion.  Neurological: She is alert and oriented to person, place, and time. She has normal strength. She displays normal reflexes. No sensory deficit. Coordination and gait normal. GCS eye subscore is 4. GCS verbal subscore is 5. GCS motor subscore is 6.  CN III-XII grossly intact.  Speech clear, no pronator drift  Skin: Skin is warm and dry. Capillary refill takes less than 2 seconds. No rash noted.  Psychiatric: She has a normal mood and affect. Thought content normal.  Nursing note and vitals reviewed.    ED Treatments / Results  Labs (all labs ordered are listed, but only abnormal results are displayed) Labs Reviewed - No data to display  EKG None  Radiology Ct Head Wo Contrast  Result Date: 10/12/2017 CLINICAL DATA:  Trauma to the left side of the head five days ago. Persistent headache and dizziness. EXAM: CT HEAD WITHOUT CONTRAST TECHNIQUE: Contiguous axial images were obtained from the base of the skull through the vertex without intravenous contrast. COMPARISON:  None. FINDINGS: Brain: The brain shows a normal appearance without evidence of malformation, atrophy, old or acute small or large vessel infarction, mass lesion, hemorrhage, hydrocephalus or extra-axial collection. Vascular: No hyperdense vessel. No evidence of atherosclerotic calcification. Skull: Normal.  No traumatic finding.  No  focal bone lesion. Sinuses/Orbits: Sinuses are clear. Orbits appear normal. Mastoids are clear. Other: None significant IMPRESSION: Normal head CT. Electronically Signed   By: Nelson Chimes M.D.   On: 10/12/2017 12:34     Procedures Procedures (including critical care time)  Medications Ordered in ED Medications - No data to display   Initial Impression / Assessment and Plan / ED Course  I have reviewed the triage vital signs and the nursing notes.  Pertinent labs & imaging results that were available during my care of the patient were reviewed by me and considered in my medical decision making (see chart for details).     Patient well-appearing.  CT head results reassuring.  No focal neuro deficit on exam.  She is ambulatory with a steady gait.  Symptoms are felt to be related to a postconcussive syndrome.  I discussed this with the patient.  She appears safe for discharge home and agrees to close PCP follow-up.  Return precautions discussed.  Final Clinical Impressions(s) / ED Diagnoses   Final diagnoses:  Post concussive syndrome    ED Discharge Orders  Ordered    traMADol (ULTRAM) 50 MG tablet  Every 6 hours PRN     10/12/17 1301           Kem Parkinson, PA-C 10/14/17 [place], [place], DO 10/15/17 [phone]

## 2018-01-18 ENCOUNTER — Telehealth: Payer: 59 | Admitting: Family Medicine

## 2018-01-18 DIAGNOSIS — H5789 Other specified disorders of eye and adnexa: Secondary | ICD-10-CM

## 2018-01-18 MED ORDER — POLYMYXIN B-TRIMETHOPRIM 10000-0.1 UNIT/ML-% OP SOLN: 1.0000 [drp] | Freq: Four times a day (QID) | OPHTHALMIC | 0 refills | Status: DC

## 2018-01-18 NOTE — Progress Notes (Signed)
We are sorry that you are not feeling well.  Here is how we plan to help!  Based on what you have shared with me it looks like you have conjunctivitis.  Conjunctivitis is a common inflammatory or infectious condition of the eye that is often referred to as "pink eye".  In most cases it is contagious (viral or bacterial). However, not all conjunctivitis requires antibiotics (ex. Allergic).  We have made appropriate suggestions for you based upon your presentation.  I have prescribed Polytrim Ophthalmic drops 1-2 drops 4 times a day times 5 days  We can attempt the medication prescribed and treat your symptoms as a pink eye if your pain and redness persists or you develop blurry or loss of vision seek evaluation from an eye specialist. If symptoms are not improved in 36 hours seek face to face evaluation as you may have something else going on that cannot be comprehensively evaluation on an e-visit.  Pink eye can be highly contagious.  It is typically spread through direct contact with secretions, or contaminated objects or surfaces that one may have touched.  Strict handwashing is suggested with soap and water is urged.  If not available, use alcohol based had sanitizer.  Avoid unnecessary touching of the eye.  If you wear contact lenses, you will need to refrain from wearing them until you see no white discharge from the eye for at least 24 hours after being on medication.  You should see symptom improvement in 1-2 days after starting the medication regimen.  Call us if symptoms are not improved in 1-2 days.  Home Care:  Wash your hands often!  Do not wear your contacts until you complete your treatment plan.  Avoid sharing towels, bed linen, personal items with a person who has pink eye.  See attention for anyone in your home with similar symptoms.  Get Help Right Away If:  Your symptoms do not improve.  You develop blurred or loss of vision.  Your symptoms worsen (increased discharge,  pain or redness)  Your e-visit answers were reviewed by a board certified advanced clinical practitioner to complete your personal care plan.  Depending on the condition, your plan could have included both over the counter or prescription medications.  If there is a problem please reply  once you have received a response from your provider.  Your safety is important to [place].  If you have drug allergies check your prescription carefully.    You can use MyChart to ask questions about today's visit, request a non-urgent call back, or ask for a work or school excuse for 24 hours related to this e-Visit. If it has been greater than 24 hours you will need to follow up with your provider, or enter a new e-Visit to address those concerns.   You will get an e-mail in the next two days asking about your experience.  I hope that your e-visit has been valuable and will speed your recovery. Thank you for using e-visits.

## 2018-01-18 NOTE — Progress Notes (Signed)
Yes, your medication was sent to the preferred pharmacy you listed. Thanks.  Patient [name], [name]

## 2018-01-19 ENCOUNTER — Encounter: Payer: Self-pay | Admitting: Family Medicine

## 2018-01-19 ENCOUNTER — Ambulatory Visit (INDEPENDENT_AMBULATORY_CARE_PROVIDER_SITE_OTHER): Payer: 59 | Admitting: Family Medicine

## 2018-01-19 VITALS — BP 108/74 | HR 86 | Resp 15 | Ht 63.0 in | Wt 138.0 lb

## 2018-01-19 DIAGNOSIS — E559 Vitamin D deficiency, unspecified: Secondary | ICD-10-CM

## 2018-01-19 DIAGNOSIS — Z0000 Encounter for general adult medical examination without abnormal findings: Secondary | ICD-10-CM

## 2018-01-19 DIAGNOSIS — H1032 Unspecified acute conjunctivitis, left eye: Secondary | ICD-10-CM

## 2018-01-19 DIAGNOSIS — Z13220 Encounter for screening for lipoid disorders: Secondary | ICD-10-CM | POA: Diagnosis not present

## 2018-01-19 MED ORDER — ERGOCALCIFEROL 1.25 MG (50000 UT) PO CAPS: 50000.0000 [IU] | ORAL_CAPSULE | ORAL | 1 refills | Status: DC

## 2018-01-19 MED ORDER — CIPROFLOXACIN HCL 500 MG PO TABS: 500.0000 mg | ORAL_TABLET | Freq: Two times a day (BID) | ORAL | 0 refills | Status: DC

## 2018-01-19 MED ORDER — FLUCONAZOLE 150 MG PO TABS: 150.0000 mg | ORAL_TABLET | Freq: Once | ORAL | 0 refills | Status: AC

## 2018-01-19 NOTE — Patient Instructions (Addendum)
Keep appt in March as before, call if you  Need me sooner,   On March 20 or shortly after , CBC, fasting lipid, chem 7 and EGFr, TSh and vit D  To be drawn, collect order at checkout   Work excuse from 11/13 to return 01/21/2018  Use antibiotic eye drop to left eye eas prescribed, keep fingers'/ hands out of eye  As this will spread the infection  5 days of antibiotic by mouth and 1 tablet for yeast are prescribed     Bacterial Conjunctivitis Bacterial conjunctivitis is an infection of your conjunctiva. This is the clear membrane that covers the white part of your eye and the inner surface of your eyelid. This condition can make your eye:  Red or pink.  Itchy.  This condition is caused by bacteria. This condition spreads very easily from person to person (is contagious) and from one eye to the other eye. Follow these instructions at home: Medicines  Take or apply your antibiotic medicine as told by your doctor. Do not stop taking or applying the antibiotic even if you start to feel better.  Take or apply over-the-counter and prescription medicines only as told by your doctor.  Do not touch your eyelid with the eye drop bottle or the ointment tube. Managing discomfort  Wipe any fluid from your eye with a warm, wet washcloth or a cotton ball.  Place a cool, clean washcloth on your eye. Do this for 10-20 minutes, 3-4 times per day. General instructions  Do not wear contact lenses until the irritation is gone. Wear glasses until your doctor says it is okay to wear contacts.  Do not wear eye makeup until your symptoms are gone. Throw away any old makeup.  Change or wash your pillowcase every day.  Do not share towels or washcloths with anyone.  Wash your hands often with soap and water. Use paper towels to dry your hands.  Do not touch or rub your eyes.  Do not drive or use heavy machinery if your vision is blurry. Contact a doctor if:  You have a fever.  Your  symptoms do not get better after 10 days. Get help right away if:  You have a fever and your symptoms suddenly get worse.  You have very bad pain when you move your eye.  Your face: ? Hurts. ? Is red. ? Is swollen.  You have sudden loss of vision. This information is not intended to replace advice given to you by your health care provider. Make sure you discuss any questions you have with your health care provider. Document Released: 12/03/2007 Document Revised: 08/01/2015 Document Reviewed: 12/06/2014 Elsevier Interactive Patient Education  [employer].

## 2018-01-19 NOTE — Assessment & Plan Note (Addendum)
2 day h/o pain , redness and drainage from lef eye. Started antibiotic drop yesterday, worked yesterday, has photographs showing purulent drainage from the eye this morning. Add oral antibiotic to topical and work excuse  To return 01/21/2018 Educated re need to desist from trauma to eye and direct contact with infected eye as this is very contagious, she understands

## 2018-01-23 ENCOUNTER — Encounter: Payer: Self-pay | Admitting: Family Medicine

## 2018-01-23 NOTE — Progress Notes (Signed)
   Elizabeth Gardner     MRN: [id number]      DOB: 08-30-1985   HPI Elizabeth Gardner is here with a 2 day h/o painful red eye, gritty feel , no trauma, or loss of vision, notes excessive crusting and purulent drainage Worked yesterday when symptoms first started , but was sent home today by her Supervisor. She was prescribed topical eye drop antibiotic yesterday. No known contact with anyone with similar symptoms ROS See HPI  Denies recent fever or chills. Denies sinus pressure, nasal congestion, ear pain or sore throat. Denies chest congestion, productive cough or wheezing. Denies dysuria, frequency, hesitancy or incontinence. Denies joint pain, swelling and limitation in mobility. Denies headaches, seizures, numbness, or tingling. Denies depression, anxiety or insomnia. Denies skin break down or rash.   PE  BP 108/74   Pulse 86   Resp 15   Ht 5\' 3"  (1.6 m)   Wt 138 lb (62.6 kg)   SpO2 100%   BMI 24.45 kg/m   Patient alert and oriented and in no cardiopulmonary distress.  HEENT: No facial asymmetry, EOMI,   oropharynx pink and moist.  Neck supple no JVD, no mass.Erythem and watery drainage from left eye  Chest: Clear to auscultation bilaterally.  CVS: S1, S2 no murmurs, no S3.Regular rate.  ABD: Soft non tender.   Ext: No edema  MS: Adequate ROM spine, shoulders, hips and knees.  Skin: Intact, no ulcerations or rash noted.  Psych: Good eye contact, normal affect. Memory intact not anxious or depressed appearing.  CNS: CN 2-12 intact, power,  normal throughout.no focal deficits noted.   Assessment & Plan  Acute conjunctivitis of left eye 2 day h/o pain , redness and drainage from lef eye. Started antibiotic drop yesterday, worked yesterday, has photographs showing purulent drainage from the eye this morning. Add oral antibiotic to topical and work excuse  To return 01/21/2018 Educated re need to desist from trauma to eye and direct contact with infected eye as this is  very contagious, she understands

## 2018-03-16 ENCOUNTER — Encounter: Payer: Self-pay | Admitting: Family Medicine

## 2018-03-28 ENCOUNTER — Other Ambulatory Visit (HOSPITAL_COMMUNITY)
Admission: RE | Admit: 2018-03-28 | Discharge: 2018-03-28 | Disposition: A | Discharge status: Home / Self Care | Payer: 59 | Source: Ambulatory Visit | Attending: Family Medicine | Admitting: Family Medicine

## 2018-03-28 ENCOUNTER — Encounter: Payer: Self-pay | Admitting: Family Medicine

## 2018-03-28 ENCOUNTER — Ambulatory Visit (INDEPENDENT_AMBULATORY_CARE_PROVIDER_SITE_OTHER): Payer: 59 | Admitting: Family Medicine

## 2018-03-28 VITALS — BP 106/62 | HR 87 | Resp 12 | Ht 63.0 in | Wt 137.1 lb

## 2018-03-28 DIAGNOSIS — Z13220 Encounter for screening for lipoid disorders: Secondary | ICD-10-CM

## 2018-03-28 DIAGNOSIS — Z0000 Encounter for general adult medical examination without abnormal findings: Secondary | ICD-10-CM

## 2018-03-28 DIAGNOSIS — Z124 Encounter for screening for malignant neoplasm of cervix: Secondary | ICD-10-CM | POA: Insufficient documentation

## 2018-03-28 DIAGNOSIS — E559 Vitamin D deficiency, unspecified: Secondary | ICD-10-CM

## 2018-03-28 NOTE — Patient Instructions (Addendum)
F/U In March  re contraception, pls check on Depo coverage and let us know   Fasting lipid, CBC, cmp and eGFr, TSH and vit D this week  Pap and STD testing today  Thank you  for choosing [facility]. We consider it a privelige to serve you.  Delivering excellent health care in a caring and  compassionate way is our goal.  Partnering with you,  so that together we can achieve this goal is our strategy.

## 2018-03-28 NOTE — Progress Notes (Signed)
    Elizabeth Gardner     MRN: [id number]      DOB: 06/28/1985  HPI: Patient is in for annual physical exam. Requests STD testing as does not consistently use condoms Immunization is reviewed , and  updated if needed.   PE: BP 106/62   Pulse 87   Resp 12   Ht 5\' 3"  (1.6 m)   Wt 137 lb 1.3 oz (62.2 kg)   SpO2 99% Comment: room air  BMI 24.28 kg/m   Pleasant  female, alert and oriented x 3, in no cardio-pulmonary distress. Afebrile. HEENT No facial trauma or asymetry. Sinuses non tender.  Extra occullar muscles intact, pupils equally reactive to light. External ears normal, tympanic membranes clear. Oropharynx moist, no exudate. Neck: supple, no adenopathy,JVD or thyromegaly.No bruits.  Chest: Clear to ascultation bilaterally.No crackles or wheezes. Non tender to palpation  Breast: No asymetry,no masses or lumps. No tenderness. No nipple discharge or inversion. No axillary or supraclavicular adenopathy  Cardiovascular system; Heart sounds normal,  S1 and  S2 ,no S3.  No murmur, or thrill. Apical beat not displaced Peripheral pulses normal.  Abdomen: Soft, non tender, no organomegaly or masses. No bruits. Bowel sounds normal. No guarding, tenderness or rebound.  GU: External genitalia normal female genitalia , normal female distribution of hair. No lesions. Urethral meatus normal in size, no  Prolapse, no lesions visibly  Present. Bladder non tender. Vagina pink and moist , with no visible lesions , discharge present . Adequate pelvic support no  cystocele or rectocele noted Cervix pink and appears healthy, no lesions or ulcerations noted, no discharge noted from os Uterus normal size, no adnexal masses, no cervical motion or adnexal tenderness.   Musculoskeletal exam: Full ROM of spine, hips , shoulders and knees. No deformity ,swelling or crepitus noted. No muscle wasting or atrophy.   Neurologic: Cranial nerves 2 to 12 intact. Power, tone ,sensation and  reflexes normal throughout. No disturbance in gait. No tremor.  Skin: Intact, no ulceration, erythema , scaling or rash noted. Pigmentation normal throughout  Psych; Normal mood and affect. Judgement and concentration normal   Assessment & Plan:  Annual physical exam Annual exam as documented. Counseling done  re healthy lifestyle involving commitment to 150 minutes exercise per week, heart healthy diet, and attaining healthy weight.The importance of adequate sleep also discussed. Regular seat belt use and home safety, is also discussed.Also use of condoms for safe sex practice Changes in health habits are decided on by the patient with goals and time frames  set for achieving them. Immunization and cancer screening needs are specifically addressed at this visit.

## 2018-03-29 ENCOUNTER — Encounter: Payer: Self-pay | Admitting: Family Medicine

## 2018-03-29 NOTE — Assessment & Plan Note (Signed)
Annual exam as documented. Counseling done  re healthy lifestyle involving commitment to 150 minutes exercise per week, heart healthy diet, and attaining healthy weight.The importance of adequate sleep also discussed. Regular seat belt use and home safety, is also discussed.Also use of condoms for safe sex practice Changes in health habits are decided on by the patient with goals and time frames  set for achieving them. Immunization and cancer screening needs are specifically addressed at this visit.

## 2018-04-01 ENCOUNTER — Encounter: Payer: Self-pay | Admitting: Family Medicine

## 2018-04-01 DIAGNOSIS — Z0000 Encounter for general adult medical examination without abnormal findings: Secondary | ICD-10-CM | POA: Diagnosis not present

## 2018-04-01 DIAGNOSIS — Z13220 Encounter for screening for lipoid disorders: Secondary | ICD-10-CM | POA: Diagnosis not present

## 2018-04-01 DIAGNOSIS — E559 Vitamin D deficiency, unspecified: Secondary | ICD-10-CM | POA: Diagnosis not present

## 2018-04-01 LAB — CYTOLOGY - PAP
BACTERIAL VAGINITIS: NEGATIVE
Candida vaginitis: NEGATIVE
Chlamydia: NEGATIVE
Diagnosis: NEGATIVE
NEISSERIA GONORRHEA: NEGATIVE
TRICH (WINDOWPATH): NEGATIVE

## 2018-04-05 ENCOUNTER — Encounter: Payer: Self-pay | Admitting: Family Medicine

## 2018-04-05 LAB — COMPLETE METABOLIC PANEL WITH GFR
AG Ratio: 1.6 (calc) (ref 1.0–2.5)
ALBUMIN MSPROF: 4.5 g/dL (ref 3.6–5.1)
ALT: 9 U/L (ref 6–29)
AST: 15 U/L (ref 10–30)
Alkaline phosphatase (APISO): 62 U/L (ref 33–115)
BUN: 10 mg/dL (ref 7–25)
CALCIUM: 9.4 mg/dL (ref 8.6–10.2)
CO2: 27 mmol/L (ref 20–32)
Chloride: 105 mmol/L (ref 98–110)
Creat: 0.69 mg/dL (ref 0.50–1.10)
GFR, EST AFRICAN AMERICAN: 134 mL/min/{1.73_m2} (ref >=60)
GFR, EST NON AFRICAN AMERICAN: 115 mL/min/{1.73_m2} (ref >=60)
GLUCOSE: 85 mg/dL (ref 65–99)
Globulin: 2.8 g/dL (calc) (ref 1.9–3.7)
Potassium: 3.9 mmol/L (ref 3.5–5.3)
Sodium: 138 mmol/L (ref 135–146)
TOTAL PROTEIN: 7.3 g/dL (ref 6.1–8.1)
Total Bilirubin: 0.6 mg/dL (ref 0.2–1.2)

## 2018-04-05 LAB — CBC
HCT: 35.1 % (ref 35.0–45.0)
HEMOGLOBIN: 10.6 g/dL — AB (ref 11.7–15.5)
MCH: 22.6 pg — ABNORMAL LOW (ref 27.0–33.0)
MCHC: 30.2 g/dL — ABNORMAL LOW (ref 32.0–36.0)
MCV: 75 fL — AB (ref 80.0–100.0)
MPV: 11.6 fL (ref 7.5–12.5)
PLATELETS: 296 10*3/uL (ref 140–400)
RBC: 4.68 10*6/uL (ref 3.80–5.10)
RDW: 14.4 % (ref 11.0–15.0)
WBC: 4.4 10*3/uL (ref 3.8–10.8)

## 2018-04-05 LAB — LIPID PANEL
CHOL/HDL RATIO: 2.6 (calc) (ref <5.0)
CHOLESTEROL: 154 mg/dL (ref <200)
HDL: 59 mg/dL (ref >50)
LDL Cholesterol (Calc): 82 mg/dL (calc)
NON-HDL CHOLESTEROL (CALC): 95 mg/dL (ref <130)
Triglycerides: 44 mg/dL (ref <150)

## 2018-04-05 LAB — TSH: TSH: 2.02 mIU/L

## 2018-04-05 LAB — FERRITIN: Ferritin: 66 ng/mL (ref 16–154)

## 2018-04-05 LAB — VITAMIN D 25 HYDROXY (VIT D DEFICIENCY, FRACTURES): VIT D 25 HYDROXY: 12 ng/mL — AB (ref 30–100)

## 2018-04-05 LAB — IRON: Iron: 107 ug/dL (ref 40–190)

## 2018-05-30 ENCOUNTER — Telehealth: Payer: Self-pay

## 2018-05-30 ENCOUNTER — Ambulatory Visit (INDEPENDENT_AMBULATORY_CARE_PROVIDER_SITE_OTHER): Payer: 59 | Admitting: Family Medicine

## 2018-05-30 DIAGNOSIS — D509 Iron deficiency anemia, unspecified: Secondary | ICD-10-CM | POA: Diagnosis not present

## 2018-05-30 DIAGNOSIS — Z3009 Encounter for other general counseling and advice on contraception: Secondary | ICD-10-CM | POA: Diagnosis not present

## 2018-05-30 DIAGNOSIS — E559 Vitamin D deficiency, unspecified: Secondary | ICD-10-CM

## 2018-05-30 NOTE — Progress Notes (Signed)
Virtual Visit via Telephone Note  I connected with Elizabeth Gardner on 05/30/18 at  3:40 PM EDT by telephone, she was in her home, I was in my office  and verified that I am speaking with the correct person using two identifiers.she consented to the telephone visit   I discussed the limitations, risks, security and privacy concerns of performing an evaluation and management service by telephone and the availability of in person appointments. I also discussed with the patient that there may be a patient responsible charge related to this service. The patient expressed understanding and agreed to proceed.   History of Present Illness:Wants to switch to depro, needs to know if covered  LMP started March 8 to 11, forgets to take oCP and has used Depo in the past successfully with no complications. Reports taking iron regularly, has no other concerns. Screening for depression and increased anxiety  Is negative     Observations/Objective: ENT: Denies nasal congestion, sinus pressure , sore throat or ear pain  RESP: Denies cough, sputum production , shortness of breath or wheezing  CVS: Denies chest pain, palpitation , leg swelling or orthopnea  GI:  Denies abdominal pain, nausea, vomiting , diarrhea or constipation  GU: Denies dysuria , frequency or incontinence  MS: denies joint pain, swelling or instability  SKIN: Denies rash or skin breakdown  PSYCH: Denies depression, anxiety  or insomnia    Assessment and Plan: Need for contraception addressed, Depo provera is covered, she is to return next week for Depo Provera 150 mg IM , and is to have a urine pregnancy test prior to depo provera being given, she will need this every 12 weeks as a nurse visit. She has been notified about this  IDA: needs to continue supplemental iron  Vit d deficiency: needs to continue once weekly vitamin d  Follow Up Instructions:    I discussed the assessment and treatment plan with the patient. The  patient was provided an opportunity to ask questions and all were answered. The patient agreed with the plan and demonstrated an understanding of the instructions.   The patient was advised to call back or seek an in-person evaluation if the symptoms worsen or if the condition fails to improve as anticipated.  I provided [phone] minutes of non-face-to-face time during this encounter.   Tula Nakayama, MD

## 2018-05-30 NOTE — Telephone Encounter (Signed)
Patient scheduled for 06/09/2018 for pregnancy test and depo provera shot per MD

## 2018-05-31 ENCOUNTER — Encounter: Payer: Self-pay | Admitting: Family Medicine

## 2018-05-31 NOTE — Patient Instructions (Signed)
Nurse visit on 06/09/2018 for urine pregnancy test and depo Provera 150 mg iM  Needs to have MD visit and 2nd Depo Provera injection 12 weeks form April 2, please schedule   It is important that you exercise regularly at least 30 minutes 5 times a week. If you develop chest pain, have severe difficulty breathing, or feel very tired, stop exercising immediately and seek medical attention   Think about what you will eat, plan ahead. Choose " clean, green, fresh or frozen" over canned, processed or packaged foods which are more sugary, salty and fatty. 70 to 75% of food eaten should be vegetables and fruit. Three meals at set times with snacks allowed between meals, but they must be fruit or vegetables. Aim to eat over a 12 hour period , example 7 am to 7 pm, and STOP after  your last meal of the day. Drink water,generally about 64 ounces per day, no other drink is as healthy. Fruit juice is best enjoyed in a healthy way, by EATING the fruit.

## 2018-06-09 ENCOUNTER — Ambulatory Visit: Payer: Self-pay

## 2018-07-11 ENCOUNTER — Ambulatory Visit: Payer: Self-pay

## 2018-07-14 ENCOUNTER — Encounter: Payer: Self-pay | Admitting: *Deleted

## 2018-08-22 ENCOUNTER — Other Ambulatory Visit: Payer: Self-pay

## 2018-08-22 ENCOUNTER — Ambulatory Visit: Payer: 59

## 2018-08-22 ENCOUNTER — Encounter: Payer: Self-pay | Admitting: Family Medicine

## 2018-08-22 ENCOUNTER — Ambulatory Visit (INDEPENDENT_AMBULATORY_CARE_PROVIDER_SITE_OTHER): Payer: 59 | Admitting: Family Medicine

## 2018-08-22 VITALS — BP 118/78 | HR 92 | Temp 98.6°F | Resp 15 | Ht 63.0 in | Wt 140.0 lb

## 2018-08-22 DIAGNOSIS — Z308 Encounter for other contraceptive management: Secondary | ICD-10-CM

## 2018-08-22 DIAGNOSIS — E559 Vitamin D deficiency, unspecified: Secondary | ICD-10-CM | POA: Diagnosis not present

## 2018-08-22 LAB — POCT URINE PREGNANCY: Preg Test, Ur: NEGATIVE

## 2018-08-22 MED ORDER — MEDROXYPROGESTERONE ACETATE 150 MG/ML IM SUSP
150.0000 mg | Freq: Once | INTRAMUSCULAR | Status: AC
  Administered 2018-08-22: 150 mg via INTRAMUSCULAR

## 2018-08-22 NOTE — Patient Instructions (Addendum)
F/u in 12 weeks for depo provera, nurse visit.  MD follow up with depo provera in 24 weeks, call if you need me sooner  Depo provera 150 mg IM today   ' ' Safe Sex Practicing safe sex means taking steps before and during sex to reduce your risk of:  Getting an STD (sexually transmitted disease).  Giving your partner an STD.  Unwanted pregnancy. How can I practice safe sex? To practice safe sex:  Limit your sexual partners to only one partner who is having sex with only you.  Avoid using alcohol and recreational drugs before having sex. These substances can affect your judgment.  Before having sex with a new partner: ? Talk to your partner about past partners, past STDs, and drug use. ? You and your partner should be screened for STDs and discuss the results with each other.  Check your body regularly for sores, blisters, rashes, or unusual discharge. If you notice any of these problems, visit your health care provider.  If you have symptoms of an infection or you are being treated for an STD, avoid sexual contact.  While having sex, use a condom. Make sure to: ? Use a condom every time you have vaginal, oral, or anal sex. Both females and males should wear condoms during oral sex. ? Keep condoms in place from the beginning to the end of sexual activity. ? Use a latex condom, if possible. Latex condoms offer the best protection. ? Use only water-based lubricants or oils to lubricate a condom. Using petroleum-based lubricants or oils will weaken the condom and increase the chance that it will break.  See your health care provider for regular screenings, exams, and tests for STDs.  Talk with your health care provider about the form of birth control (contraception) that is best for you.  Get vaccinated against hepatitis B and human papillomavirus (HPV).  If you are at risk of being infected with HIV (human immunodeficiency virus), talk with your health care provider about  taking a prescription medicine to prevent HIV infection. You are considered at risk for HIV if: ? You are a man who has sex with other men. ? You are a heterosexual man or woman who is sexually active with more than one partner. ? You take drugs by injection. ? You are sexually active with a partner who has HIV. This information is not intended to replace advice given to you by your health care provider. Make sure you discuss any questions you have with your health care provider. Document Released: 04/02/2004 Document Revised: 07/10/2015 Document Reviewed: 01/13/2015 Elsevier Interactive Patient Education  2019 [employer].

## 2018-08-22 NOTE — Progress Notes (Signed)
   Elizabeth Gardner     MRN: [id number]      DOB: March 01, 1986   HPI Ms. Mcconahy is here for follow up and starting depo provera LMP 06/01 through 06/04 Uses condoms, and denies vaginal d/c or pain ROS Denies recent fever or chills. Denies sinus pressure, nasal congestion, ear pain or sore throat. Denies chest congestion, productive cough or wheezing. Denies chest pains, palpitations and leg swelling Denies abdominal pain, nausea, vomiting,diarrhea or constipation.   Denies dysuria, frequency, hesitancy or incontinence. Denies joint pain, swelling and limitation in mobility. Denies headaches, seizures, numbness, or tingling. Denies depression, anxiety or insomnia. Denies skin break down or rash.   PE  BP 118/78   Pulse 92   Temp 98.6 F (37 C) (Temporal)   Resp 15   Ht 5\' 3"  (1.6 m)   Wt 140 lb (63.5 kg)   LMP 07/12/2018   SpO2 96%   BMI 24.80 kg/m   Patient alert and oriented and in no cardiopulmonary distress.  HEENT: No facial asymmetry, EOMI,   oropharynx pink and moist.  Neck supple no JVD, no mass.  Chest: Clear to auscultation bilaterally.  CVS: S1, S2 no murmurs, no S3.Regular rate.  ABD: Soft non tender.   Ext: No edema  MS: Adequate ROM spine, shoulders, hips and knees.  Skin: Intact, no ulcerations or rash noted.  Psych: Good eye contact, normal affect. Memory intact not anxious or depressed appearing.  CNS: CN 2-12 intact, power,  normal throughout.no focal deficits noted.   Assessment & Plan  Contraceptive management Urine pregnancy test negative. Depo Provera 150 mg Im administered Advised use of condoms for safe sex which she already does Return in 12 weeks  Vitamin D deficiency Continue weekly vit D

## 2018-08-28 ENCOUNTER — Encounter: Payer: Self-pay | Admitting: Family Medicine

## 2018-08-28 NOTE — Assessment & Plan Note (Signed)
Urine pregnancy test negative. Depo Provera 150 mg Im administered Advised use of condoms for safe sex which she already does Return in 24 weeks

## 2018-08-28 NOTE — Assessment & Plan Note (Signed)
Continue weekly vit D 

## 2018-09-01 ENCOUNTER — Ambulatory Visit: Payer: Self-pay

## 2018-09-01 ENCOUNTER — Ambulatory Visit: Payer: Self-pay | Admitting: Family Medicine

## 2018-09-12 ENCOUNTER — Ambulatory Visit: Payer: Self-pay

## 2018-10-27 ENCOUNTER — Encounter: Payer: Self-pay | Admitting: Family Medicine

## 2018-11-15 ENCOUNTER — Other Ambulatory Visit: Payer: Self-pay

## 2018-11-15 ENCOUNTER — Ambulatory Visit (INDEPENDENT_AMBULATORY_CARE_PROVIDER_SITE_OTHER): Payer: 59

## 2018-11-15 DIAGNOSIS — Z308 Encounter for other contraceptive management: Secondary | ICD-10-CM

## 2018-11-15 MED ORDER — MEDROXYPROGESTERONE ACETATE 150 MG/ML IM SUSP
150.0000 mg | Freq: Once | INTRAMUSCULAR | Status: AC
  Administered 2018-11-15: 16:00:00 150 mg via INTRAMUSCULAR

## 2018-11-15 NOTE — Progress Notes (Signed)
Patient comes in today for contraceptive management. Given in left upper glute. No problems.

## 2018-11-19 ENCOUNTER — Other Ambulatory Visit: Payer: Self-pay | Admitting: Family Medicine

## 2018-12-14 ENCOUNTER — Encounter: Payer: Self-pay | Admitting: Family Medicine

## 2019-02-07 ENCOUNTER — Encounter: Payer: Self-pay | Admitting: Family Medicine

## 2019-02-07 ENCOUNTER — Other Ambulatory Visit: Payer: Self-pay

## 2019-02-07 ENCOUNTER — Ambulatory Visit (INDEPENDENT_AMBULATORY_CARE_PROVIDER_SITE_OTHER): Payer: 59 | Admitting: Family Medicine

## 2019-02-07 VITALS — BP 104/74 | HR 85 | Temp 98.4°F | Resp 15 | Ht 63.0 in | Wt 144.0 lb

## 2019-02-07 DIAGNOSIS — D509 Iron deficiency anemia, unspecified: Secondary | ICD-10-CM | POA: Diagnosis not present

## 2019-02-07 DIAGNOSIS — R7989 Other specified abnormal findings of blood chemistry: Secondary | ICD-10-CM

## 2019-02-07 DIAGNOSIS — F4321 Adjustment disorder with depressed mood: Secondary | ICD-10-CM | POA: Diagnosis not present

## 2019-02-07 DIAGNOSIS — Z13220 Encounter for screening for lipoid disorders: Secondary | ICD-10-CM

## 2019-02-07 DIAGNOSIS — E559 Vitamin D deficiency, unspecified: Secondary | ICD-10-CM

## 2019-02-07 DIAGNOSIS — Z308 Encounter for other contraceptive management: Secondary | ICD-10-CM | POA: Diagnosis not present

## 2019-02-07 MED ORDER — MEDROXYPROGESTERONE ACETATE 150 MG/ML IM SUSP
150.0000 mg | Freq: Once | INTRAMUSCULAR | Status: AC
  Administered 2019-02-07: 150 mg via INTRAMUSCULAR

## 2019-02-07 NOTE — Patient Instructions (Addendum)
Annual physical exam in office with MD in 12 weeks , also depo prover injection, with pelvic exam   Condolence on recent loss of your Dad.  Please get fasting CBC, lipid, cmp and eGFR, tSH and vit D 1 week before next visit.  Continue your current medications  It is important that you exercise regularly at least 30 minutes 5 times a week. If you develop chest pain, have severe difficulty breathing, or feel very tired, stop exercising immediately and seek medical attention   Thanks for choosing [facility], we consider it a privelige to serve you.

## 2019-02-07 NOTE — Progress Notes (Signed)
Virtual Visit via Telephone Note  I connected with Elizabeth Gardner on 02/07/19 at  3:00 PM EST by telephone and verified that I am speaking with the correct person using two identifiers.  Location: Patient: car Provider: office    I discussed the limitations, risks, security and privacy concerns of performing an evaluation and management service by telephone and the availability of in person appointments. I also discussed with the patient that there may be a patient responsible charge related to this service. The patient expressed understanding and agreed to proceed.   History of Present Illness: Pt in fpor depo administration, had requested face to face with MD before this visit, however, in light of pandemic , since she had no new physical complaints , this was converted t a phone visit C/o recent loss of her Dad who had been ill, but still experiencing grief, states not overwhelming, but real. Has support from her Mom and sibling, does not need therapy currently ROS; negative otherwise, specifically negative for fever, chill, cough sputum production or sinus pressure and discharge   Observations/Objective: BP 104/74   Pulse 85   Temp 98.4 F (36.9 C) (Temporal)   Resp 15   Ht 5\' 3"  (1.6 m)   Wt 144 lb (65.3 kg)   SpO2 99%   BMI 25.51 kg/m    Assessment and Plan: Contraceptive management Depo 150 mg iM administered  Vitamin D deficiency Reports compliance with weekly vit D Updated lab needed at/ before next visit.   Grief Appropriate grief reaction due to recent loss of parent, pt ventilated for approx 10 mins, therapy not indicated currently    Follow Up Instructions:    I discussed the assessment and treatment plan with the patient. The patient was provided an opportunity to ask questions and all were answered. The patient agreed with the plan and demonstrated an understanding of the instructions.   The patient was advised to call back or seek an in-person  evaluation if the symptoms worsen or if the condition fails to improve as anticipated.  I provided 10 minutes of non-face-to-face time during this encounter.   Tula Nakayama, MD

## 2019-02-12 ENCOUNTER — Encounter: Payer: Self-pay | Admitting: Family Medicine

## 2019-02-12 DIAGNOSIS — F4321 Adjustment disorder with depressed mood: Secondary | ICD-10-CM

## 2019-02-12 HISTORY — DX: Adjustment disorder with depressed mood: F43.21

## 2019-02-12 NOTE — Assessment & Plan Note (Signed)
Appropriate grief reaction due to recent loss of parent, pt ventilated for approx 10 mins, therapy not indicated currently

## 2019-02-12 NOTE — Assessment & Plan Note (Signed)
Reports compliance with weekly vit D Updated lab needed at/ before next visit.

## 2019-02-12 NOTE — Assessment & Plan Note (Signed)
Depo 150 mg iM administered

## 2019-02-20 DIAGNOSIS — Z20828 Contact with and (suspected) exposure to other viral communicable diseases: Secondary | ICD-10-CM | POA: Diagnosis not present

## 2019-03-07 ENCOUNTER — Encounter: Payer: Self-pay | Admitting: Family Medicine

## 2019-04-10 ENCOUNTER — Ambulatory Visit: Payer: 59

## 2019-05-08 ENCOUNTER — Encounter: Payer: 59 | Admitting: Family Medicine

## 2019-05-08 ENCOUNTER — Ambulatory Visit: Payer: 59

## 2019-05-11 ENCOUNTER — Other Ambulatory Visit: Payer: Self-pay

## 2019-05-11 ENCOUNTER — Ambulatory Visit (INDEPENDENT_AMBULATORY_CARE_PROVIDER_SITE_OTHER): Payer: 59

## 2019-05-11 ENCOUNTER — Encounter: Payer: 59 | Admitting: Family Medicine

## 2019-05-11 DIAGNOSIS — Z308 Encounter for other contraceptive management: Secondary | ICD-10-CM | POA: Diagnosis not present

## 2019-05-11 MED ORDER — MEDROXYPROGESTERONE ACETATE 150 MG/ML IM SUSP
150.0000 mg | Freq: Once | INTRAMUSCULAR | Status: AC
  Administered 2019-05-11: 150 mg via INTRAMUSCULAR

## 2019-05-11 NOTE — Progress Notes (Signed)
Depo given in right hip. Return 05/20-06/03

## 2019-05-15 ENCOUNTER — Encounter: Payer: 59 | Admitting: Family Medicine

## 2019-05-15 DIAGNOSIS — R7989 Other specified abnormal findings of blood chemistry: Secondary | ICD-10-CM | POA: Diagnosis not present

## 2019-05-15 DIAGNOSIS — E559 Vitamin D deficiency, unspecified: Secondary | ICD-10-CM | POA: Diagnosis not present

## 2019-05-15 DIAGNOSIS — Z13220 Encounter for screening for lipoid disorders: Secondary | ICD-10-CM | POA: Diagnosis not present

## 2019-05-15 DIAGNOSIS — D509 Iron deficiency anemia, unspecified: Secondary | ICD-10-CM | POA: Diagnosis not present

## 2019-05-16 ENCOUNTER — Ambulatory Visit: Payer: 59

## 2019-05-16 LAB — CBC
HCT: 35.6 % (ref 35.0–45.0)
Hemoglobin: 11.1 g/dL — ABNORMAL LOW (ref 11.7–15.5)
MCH: 23.3 pg — ABNORMAL LOW (ref 27.0–33.0)
MCHC: 31.2 g/dL — ABNORMAL LOW (ref 32.0–36.0)
MCV: 74.8 fL — ABNORMAL LOW (ref 80.0–100.0)
MPV: 11.9 fL (ref 7.5–12.5)
Platelets: 293 10*3/uL (ref 140–400)
RBC: 4.76 10*6/uL (ref 3.80–5.10)
RDW: 14.8 % (ref 11.0–15.0)
WBC: 5.8 10*3/uL (ref 3.8–10.8)

## 2019-05-16 LAB — VITAMIN D 25 HYDROXY (VIT D DEFICIENCY, FRACTURES): Vit D, 25-Hydroxy: 18 ng/mL — ABNORMAL LOW (ref 30–100)

## 2019-05-16 LAB — COMPLETE METABOLIC PANEL WITH GFR
AG Ratio: 1.7 (calc) (ref 1.0–2.5)
ALT: 10 U/L (ref 6–29)
AST: 14 U/L (ref 10–30)
Albumin: 4.3 g/dL (ref 3.6–5.1)
Alkaline phosphatase (APISO): 56 U/L (ref 31–125)
BUN: 12 mg/dL (ref 7–25)
CO2: 23 mmol/L (ref 20–32)
Calcium: 9.6 mg/dL (ref 8.6–10.2)
Chloride: 108 mmol/L (ref 98–110)
Creat: 0.7 mg/dL (ref 0.50–1.10)
GFR, Est African American: 132 mL/min/{1.73_m2} (ref >=60)
GFR, Est Non African American: 114 mL/min/{1.73_m2} (ref >=60)
Globulin: 2.6 g/dL (calc) (ref 1.9–3.7)
Glucose, Bld: 82 mg/dL (ref 65–99)
Potassium: 4.1 mmol/L (ref 3.5–5.3)
Sodium: 140 mmol/L (ref 135–146)
Total Bilirubin: 0.7 mg/dL (ref 0.2–1.2)
Total Protein: 6.9 g/dL (ref 6.1–8.1)

## 2019-05-16 LAB — LIPID PANEL
Cholesterol: 141 mg/dL (ref <200)
HDL: 58 mg/dL (ref >=50)
LDL Cholesterol (Calc): 72 mg/dL (calc)
Non-HDL Cholesterol (Calc): 83 mg/dL (calc) (ref <130)
Total CHOL/HDL Ratio: 2.4 (calc) (ref <5.0)
Triglycerides: 40 mg/dL (ref <150)

## 2019-05-16 LAB — TSH: TSH: 1.94 mIU/L

## 2019-05-22 ENCOUNTER — Encounter: Payer: Self-pay | Admitting: Family Medicine

## 2019-05-26 ENCOUNTER — Other Ambulatory Visit: Payer: Self-pay

## 2019-05-26 ENCOUNTER — Ambulatory Visit (INDEPENDENT_AMBULATORY_CARE_PROVIDER_SITE_OTHER): Payer: 59 | Admitting: Family Medicine

## 2019-05-26 ENCOUNTER — Encounter: Payer: Self-pay | Admitting: Family Medicine

## 2019-05-26 VITALS — BP 114/69 | HR 81 | Temp 97.7°F | Ht 66.0 in | Wt 134.7 lb

## 2019-05-26 DIAGNOSIS — D509 Iron deficiency anemia, unspecified: Secondary | ICD-10-CM | POA: Diagnosis not present

## 2019-05-26 DIAGNOSIS — F4321 Adjustment disorder with depressed mood: Secondary | ICD-10-CM

## 2019-05-26 DIAGNOSIS — Z0001 Encounter for general adult medical examination with abnormal findings: Secondary | ICD-10-CM

## 2019-05-26 DIAGNOSIS — E559 Vitamin D deficiency, unspecified: Secondary | ICD-10-CM | POA: Diagnosis not present

## 2019-05-26 MED ORDER — VITAMIN D (ERGOCALCIFEROL) 1.25 MG (50000 UNIT) PO CAPS: ORAL_CAPSULE | ORAL | 1 refills | Status: DC

## 2019-05-26 NOTE — Patient Instructions (Addendum)
I appreciate the opportunity to provide you with care for your health and wellness. Great to meet you today.  Today we discussed: overall health  Follow up: 1 year for annual  No labs or referrals today  Please call the office with the dose of your Vitamin D gummies.  Please start taking the weekly Vitamin D for the next 12 weeks.  Please get a prenatal vitamin to help with anemia (iron needs).  Please continue to practice social distancing to keep you, your family, and our community safe.  If you must go out, please wear a mask and practice good handwashing.  It was a pleasure to see you and I look forward to continuing to work together on your health and well-being. Please do not hesitate to call the office if you need care or have questions about your care.  Have a wonderful day and week. With Gratitude, Cherly Beach, DNP, AGNP-BC  HEALTH MAINTENANCE RECOMMENDATIONS:  It is recommended that you get at least 30 minutes of aerobic exercise at least 5 days/week (for weight loss, you may need as much as 60-90 minutes). This can be any activity that gets your heart rate up. This can be divided in 10-15 minute intervals if needed, but try and build up your endurance at least once a week.  Weight bearing exercise is also recommended twice weekly.  Eat a healthy diet with lots of vegetables, fruits and fiber.  "Colorful" foods have a lot of vitamins (ie green vegetables, tomatoes, red peppers, etc).  Limit sweet tea, regular sodas and alcoholic beverages, all of which has a lot of calories and sugar.  Up to 1 alcoholic drink daily may be beneficial for women (unless trying to lose weight, watch sugars).  Drink a lot of water.  Calcium recommendations are 1200-1500 mg daily (1500 mg for postmenopausal women or women without ovaries), and vitamin D 1000 IU daily.  This should be obtained from diet and/or supplements (vitamins), and calcium should not be taken all at once, but in divided  doses.  Monthly self breast exams and yearly mammograms for women over the age of 52 is recommended.  Sunscreen of at least SPF 30 should be used on all sun-exposed parts of the skin when outside between the hours of 10 am and 4 pm (not just when at beach or pool, but even with exercise, golf, tennis, and yard work!)  Use a sunscreen that says "broad spectrum" so it covers both UVA and UVB rays, and make sure to reapply every 1-2 hours.  Remember to change the batteries in your smoke detectors when changing your clock times in the spring and fall.  Use your seat belt every time you are in a car, and please drive safely and not be distracted with cell phones and texting while driving.

## 2019-05-26 NOTE — Assessment & Plan Note (Signed)
Encouraged to start prenatal to get the iron supplementation she needs.

## 2019-05-26 NOTE — Assessment & Plan Note (Signed)
Restarting the Vitamin D weekly, then will transition to daily OTC

## 2019-05-26 NOTE — Assessment & Plan Note (Signed)
Discussed monthly self breast exams and yearly mammograms; at least 30 minutes of aerobic activity at least 5 days/week and weight-bearing exercise 2x/week; proper sunscreen use reviewed; healthy diet, including goals of calcium and vitamin D intake and alcohol recommendations (less than or equal to 1 drink/day) reviewed; regular seatbelt use; changing batteries in smoke detectors.  Immunization recommendations discussed.  Colonoscopy recommendations reviewed.  

## 2019-05-26 NOTE — Assessment & Plan Note (Signed)
Reports doing better with the lost of her father. Overall declines therapy or medications. NO SI or HI

## 2019-05-26 NOTE — Progress Notes (Signed)
Health Maintenance reviewed -   Immunization History  Administered Date(s) Administered  . Hpv 01/19/2008, 04/19/2008, 07/12/2008  . Influenza Split 12/04/2011  . Influenza Whole 12/02/2010  . Influenza,inj,Quad PF,6+ Mos 12/05/2012  . PPD Test 06/03/2012  . Tdap 12/02/2010   Last Pap smear: 2020 Last mammogram: n/a  Last colonoscopy: n/a  Last DEXA: n/a Dentist: twice yearly Ophtho: no need Exercise: not really    Other doctors caring for patient include:  Patient Care Team: Fayrene Helper, MD as PCP - General  End of Life Discussion:  Patient does not have a living will and medical power of attorney    Code Status: Not on file   Subjective:   HPI  Elizabeth Gardner is a 34 y.o. female who presents for annual wellness visit and follow-up on chronic medical conditions.  She has the following concerns: none  Review Of Systems  Review of Systems  Constitutional: Negative.   HENT: Negative.   Eyes: Negative.   Respiratory: Negative.   Cardiovascular: Negative.   Gastrointestinal: Negative.   Endocrine: Negative.   Genitourinary: Negative.   Musculoskeletal: Negative.   Skin: Negative.   Allergic/Immunologic: Negative.   Neurological: Negative.   Hematological: Negative.   Psychiatric/Behavioral: Negative.   All other systems reviewed and are negative.   Objective:   PHYSICAL EXAM:  BP 114/69 (BP Location: Right Arm, Patient Position: Sitting, Cuff Size: Normal)   Pulse 81   Temp 97.7 F (36.5 C) (Oral)   Ht 5\' 6"  (1.676 m)   Wt 134 lb 11.2 oz (61.1 kg)   SpO2 100%   BMI 21.74 kg/m   Physical Exam Vitals and nursing note reviewed.  Constitutional:      Appearance: Normal appearance. She is normal weight.  HENT:     Head: Normocephalic.     Jaw: There is normal jaw occlusion.     Right Ear: Hearing, tympanic membrane, ear canal and external ear normal.     Left Ear: Hearing, tympanic membrane, ear canal and external ear normal.   Nose: Nose normal.     Mouth/Throat:     Lips: Pink.     Mouth: Mucous membranes are moist.     Tongue: No lesions.     Pharynx: Oropharynx is clear.     Tonsils: No tonsillar exudate or tonsillar abscesses. 0 on the right. 0 on the left.     Comments: Torus Palatinus noted  Eyes:     General: Lids are normal.     Extraocular Movements: Extraocular movements intact.     Conjunctiva/sclera: Conjunctivae normal.     Pupils: Pupils are equal, round, and reactive to light.  Cardiovascular:     Rate and Rhythm: Normal rate and regular rhythm.     Pulses: Normal pulses.          Radial pulses are 2+ on the right side and 2+ on the left side.       Dorsalis pedis pulses are 2+ on the right side and 2+ on the left side.     Heart sounds: Normal heart sounds.  Pulmonary:     Effort: Pulmonary effort is normal.     Breath sounds: Normal breath sounds.  Chest:     Chest wall: No mass, lacerations, deformity, swelling or tenderness.     Breasts: Tanner Score is 5.        Right: Normal. No swelling, bleeding, mass, nipple discharge or tenderness.  Left: Normal. No swelling, bleeding, mass, nipple discharge or tenderness.  Abdominal:     General: Abdomen is flat. Bowel sounds are normal.     Palpations: Abdomen is soft.  Musculoskeletal:        General: Normal range of motion.     Cervical back: Normal range of motion and neck supple.     Right lower leg: No edema.     Left lower leg: No edema.  Lymphadenopathy:     Cervical: No cervical adenopathy.     Right cervical: No superficial, deep or posterior cervical adenopathy.    Left cervical: No superficial, deep or posterior cervical adenopathy.     Upper Body:     Right upper body: No supraclavicular, axillary or pectoral adenopathy.     Left upper body: No supraclavicular, axillary or pectoral adenopathy.  Skin:    General: Skin is warm and dry.     Capillary Refill: Capillary refill takes less than 2 seconds.  Neurological:      General: No focal deficit present.     Mental Status: She is alert and oriented to person, place, and time. Mental status is at baseline.     Cranial Nerves: Cranial nerves are intact.     Sensory: Sensation is intact.     Motor: Motor function is intact.     Coordination: Coordination is intact.     Gait: Gait is intact.     Deep Tendon Reflexes: Reflexes are normal and symmetric.  Psychiatric:        Attention and Perception: Attention and perception normal.        Mood and Affect: Mood and affect normal.        Speech: Speech normal.        Behavior: Behavior normal. Behavior is cooperative.        Thought Content: Thought content normal.        Cognition and Memory: Cognition and memory normal.        Judgment: Judgment normal.     Depression Screening  Depression screen [facility] 2/9 05/26/2019 08/22/2018 05/30/2018 03/28/2018 01/19/2018  Decreased Interest 0 0 0 0 0  Down, Depressed, Hopeless 0 0 0 0 0  PHQ - 2 Score 0 0 0 0 0  Altered sleeping 0 - - - -  Tired, decreased energy 0 - - - -  Change in appetite 0 - - - -  Feeling bad or failure about yourself  0 - - - -  Trouble concentrating 0 - - - -  Moving slowly or fidgety/restless 0 - - - -  Suicidal thoughts 0 - - - -  PHQ-9 Score 0 - - - -  Difficult doing work/chores Not difficult at all - - - -      Assessment & Plan:   1. Vitamin D deficiency   2. Grief   3. Iron deficiency anemia, unspecified iron deficiency anemia type     Tests ordered No orders of the defined types were placed in this encounter.   Plan: Please see assessment and plan per problem list above.   Meds ordered this encounter  Medications  . Vitamin D, Ergocalciferol, (DRISDOL) 1.25 MG (50000 UNIT) CAPS capsule    Sig: TAKE 1 CAPSULES BY MOUTH ONCE A WEEK    Dispense:  12 capsule    Refill:  1    Order Specific Question:   Supervising Provider    Answer:   Fayrene Helper [id number]    The  patient's weight, height, BMI, and visual  acuity have been recorded in the chart.  I have made referrals, counseling, and provided education to the patient based on review of the above and I have provided the patient with a written personalized care plan for preventive services.    Perlie Mayo, NP   05/26/2019

## 2019-08-09 ENCOUNTER — Encounter: Payer: Self-pay | Admitting: Family Medicine

## 2019-08-10 ENCOUNTER — Encounter: Payer: Self-pay | Admitting: Family Medicine

## 2019-08-10 ENCOUNTER — Ambulatory Visit: Payer: 59

## 2019-08-10 ENCOUNTER — Other Ambulatory Visit: Payer: Self-pay

## 2019-08-10 ENCOUNTER — Other Ambulatory Visit: Payer: Self-pay | Admitting: Family Medicine

## 2019-08-10 MED ORDER — NORETHINDRONE-ETH ESTRADIOL 1-35 MG-MCG PO TABS: 1.0000 | ORAL_TABLET | Freq: Every day | ORAL | 1 refills | Status: DC

## 2019-12-19 ENCOUNTER — Encounter: Payer: Self-pay | Admitting: Internal Medicine

## 2019-12-19 ENCOUNTER — Ambulatory Visit (INDEPENDENT_AMBULATORY_CARE_PROVIDER_SITE_OTHER): Payer: 59 | Admitting: Internal Medicine

## 2019-12-19 ENCOUNTER — Other Ambulatory Visit: Payer: Self-pay

## 2019-12-19 VITALS — BP 128/82 | HR 98 | Temp 97.0°F | Resp 18 | Ht 63.0 in | Wt 137.4 lb

## 2019-12-19 DIAGNOSIS — Z3200 Encounter for pregnancy test, result unknown: Secondary | ICD-10-CM | POA: Diagnosis not present

## 2019-12-19 DIAGNOSIS — N926 Irregular menstruation, unspecified: Secondary | ICD-10-CM | POA: Diagnosis not present

## 2019-12-19 LAB — POCT URINE PREGNANCY: Preg Test, Ur: NEGATIVE

## 2019-12-19 NOTE — Patient Instructions (Signed)
Your pregnancy test reported to be negative.   Please observe for at least 2 weeks. If the you have menstruation at that time, it must have a missed period. If you continue to have irregular period, please let us know to be evaluated.  It is very rare for false-negative pregnancy test. Okay to repeat the pregnancy test after a week to confirm.  If you notice any unusual vaginal discharge or pain while peeing, please get medical evaluation.

## 2019-12-19 NOTE — Progress Notes (Signed)
Acute Office Visit  Subjective:    Patient ID: Elizabeth Gardner, female    DOB: 1985-11-08, 34 y.o.   MRN: [id number]  Chief Complaint  Patient presents with  . Possible Pregnancy    possible pregnant missed period she is 15 days late     HPI Patient is in today for evaluation of a missed period. Patient has not had menstrual cycle  2 weeks beyond her expected date for menstruation. She has a history of regular menstrual cycle. She is currently not on any form of contraception. She denies noticing any spotting or vaginal discharge. She denies any fever, chills, nausea, vomiting, abdominal pain.  Past Medical History:  Diagnosis Date  . General counseling for prescription of oral contraceptives   . GERD (gastroesophageal reflux disease) 05/26/2016  . Other general counseling and advice for contraceptive management     Past Surgical History:  Procedure Laterality Date  . BACK SURGERY     for scoliosis     Family History  Problem Relation Age of Onset  . Hypertension Mother     Social History   Socioeconomic History  . Marital status: Single    Spouse name: Not on file  . Number of children: 1  . Years of education: Not on file  . Highest education level: Not on file  Occupational History    Employer: [employer]  Tobacco Use  . Smoking status: Never Smoker  . Smokeless tobacco: Never Used  Substance and Sexual Activity  . Alcohol use: No  . Drug use: No  . Sexual activity: Not on file  Other Topics Concern  . Not on file  Social History Narrative  . Not on file   Social Determinants of Health   Financial Resource Strain:   . Difficulty of Paying Living Expenses: Not on file  Food Insecurity:   . Worried About Charity fundraiser in the Last Year: Not on file  . Ran Out of Food in the Last Year: Not on file  Transportation Needs:   . Lack of Transportation (Medical): Not on file  . Lack of Transportation (Non-Medical): Not on file  Physical Activity:   .  Days of Exercise per Week: Not on file  . Minutes of Exercise per Session: Not on file  Stress:   . Feeling of Stress : Not on file  Social Connections:   . Frequency of Communication with Friends and Family: Not on file  . Frequency of Social Gatherings with Friends and Family: Not on file  . Attends Religious Services: Not on file  . Active Member of Clubs or Organizations: Not on file  . Attends Archivist Meetings: Not on file  . Marital Status: Not on file  Intimate Partner Violence:   . Fear of Current or Ex-Partner: Not on file  . Emotionally Abused: Not on file  . Physically Abused: Not on file  . Sexually Abused: Not on file    Outpatient Medications Prior to Visit  Medication Sig Dispense Refill  . norethindrone-ethinyl estradiol 1/35 (ORTHO-NOVUM) tablet Take 1 tablet by mouth daily. 3 Package 1  . polyethylene glycol powder (GLYCOLAX/MIRALAX) powder 17 g in 8 ounces water daily as needed , for constipation 3350 g 1  . Vitamin D, Ergocalciferol, (DRISDOL) 1.25 MG (50000 UNIT) CAPS capsule TAKE 1 CAPSULES BY MOUTH ONCE A WEEK 12 capsule 1  . Influenza (>/= 3 years) inactive virus vaccine (FLVIRIN/FLUZONE) injection SUSP 0.5 mL  No facility-administered medications prior to visit.    No Known Allergies  Review of Systems  Constitutional: Negative for chills and fever.  HENT: Negative for congestion, postnasal drip, rhinorrhea, sinus pressure, sinus pain and sore throat.   Eyes: Negative for pain and discharge.  Respiratory: Negative for cough and shortness of breath.   Cardiovascular: Negative for chest pain and palpitations.  Gastrointestinal: Negative for abdominal pain, constipation, diarrhea, nausea and vomiting.  Endocrine: Negative for polydipsia and polyuria.  Genitourinary: Negative for dysuria, hematuria, vaginal discharge and vaginal pain.  Musculoskeletal: Negative for neck pain and neck stiffness.  Skin: Negative for rash.  Neurological:  Negative for dizziness and weakness.  Psychiatric/Behavioral: Negative for agitation and behavioral problems.       Objective:    Physical Exam Vitals reviewed.  Constitutional:      General: She is not in acute distress.    Appearance: She is not diaphoretic.  HENT:     Head: Normocephalic and atraumatic.     Nose: Nose normal.     Mouth/Throat:     Mouth: Mucous membranes are moist.  Eyes:     General: No scleral icterus.    Extraocular Movements: Extraocular movements intact.     Pupils: Pupils are equal, round, and reactive to light.  Cardiovascular:     Rate and Rhythm: Normal rate and regular rhythm.     Pulses: Normal pulses.     Heart sounds: Normal heart sounds. No murmur heard.   Pulmonary:     Breath sounds: Normal breath sounds. No wheezing or rales.  Abdominal:     Palpations: Abdomen is soft.     Tenderness: There is no abdominal tenderness. There is no guarding or rebound.  Musculoskeletal:     Cervical back: Neck supple. No tenderness.     Right lower leg: No edema.     Left lower leg: No edema.  Skin:    General: Skin is warm.     Findings: No rash.  Neurological:     General: No focal deficit present.     Mental Status: She is alert and oriented to person, place, and time.  Psychiatric:        Mood and Affect: Mood normal.        Behavior: Behavior normal.     BP 128/82 (BP Location: Right Arm, Patient Position: Sitting, Cuff Size: Normal)   Pulse 98   Temp (!) 97 F (36.1 C) (Temporal)   Resp 18   Ht 5\' 3"  (1.6 m)   Wt 137 lb 6.4 oz (62.3 kg)   SpO2 98%   BMI 24.34 kg/m  Wt Readings from Last 3 Encounters:  12/19/19 137 lb 6.4 oz (62.3 kg)  05/26/19 134 lb 11.2 oz (61.1 kg)  02/07/19 144 lb (65.3 kg)    Health Maintenance Due  Topic Date Due  . Hepatitis C Screening  Never done  . COVID-19 Vaccine (1) Never done    There are no preventive care reminders to display for this patient.   Lab Results  Component Value Date   TSH  1.94 05/15/2019   Lab Results  Component Value Date   WBC 5.8 05/15/2019   HGB 11.1 (L) 05/15/2019   HCT 35.6 05/15/2019   MCV 74.8 (L) 05/15/2019   PLT 293 05/15/2019   Lab Results  Component Value Date   NA 140 05/15/2019   K 4.1 05/15/2019   CO2 23 05/15/2019   GLUCOSE 82 05/15/2019   BUN 12  05/15/2019   CREATININE 0.70 05/15/2019   BILITOT 0.7 05/15/2019   ALKPHOS 56 05/26/2016   AST 14 05/15/2019   ALT 10 05/15/2019   PROT 6.9 05/15/2019   ALBUMIN 3.9 05/26/2016   CALCIUM 9.6 05/15/2019   Lab Results  Component Value Date   CHOL 141 05/15/2019   Lab Results  Component Value Date   HDL 58 05/15/2019   Lab Results  Component Value Date   LDLCALC 72 05/15/2019   Lab Results  Component Value Date   TRIG 40 05/15/2019   Lab Results  Component Value Date   CHOLHDL 2.4 05/15/2019   No results found for: HGBA1C     Assessment & Plan:   Missed period Has h/o regular cycles Pregnancy test negative in the office Advised to wait for at least 2 weeks If periods remain irregular, advised to call back for evaluation Can check pregnancy test again after 1 week, although very less chance of false-negative test Advised to get medical attention if she has vaginal discharge or other systemic symptoms like fever, chills, abdominal pain   No orders of the defined types were placed in this encounter.    Lindell Spar, MD

## 2020-05-27 ENCOUNTER — Encounter: Payer: 59 | Admitting: Family Medicine

## 2020-07-01 ENCOUNTER — Other Ambulatory Visit (HOSPITAL_COMMUNITY)
Admission: RE | Admit: 2020-07-01 | Discharge: 2020-07-01 | Disposition: A | Discharge status: Home / Self Care | Payer: 59 | Source: Ambulatory Visit | Attending: Family Medicine | Admitting: Family Medicine

## 2020-07-01 ENCOUNTER — Encounter: Payer: Self-pay | Admitting: Family Medicine

## 2020-07-01 ENCOUNTER — Ambulatory Visit (HOSPITAL_COMMUNITY)
Admission: RE | Admit: 2020-07-01 | Discharge: 2020-07-01 | Disposition: A | Discharge status: Home / Self Care | Payer: 59 | Source: Ambulatory Visit | Attending: Family Medicine | Admitting: Family Medicine

## 2020-07-01 ENCOUNTER — Ambulatory Visit (INDEPENDENT_AMBULATORY_CARE_PROVIDER_SITE_OTHER): Payer: 59 | Admitting: Family Medicine

## 2020-07-01 ENCOUNTER — Other Ambulatory Visit: Payer: Self-pay

## 2020-07-01 VITALS — BP 127/72 | HR 74 | Temp 97.6°F | Ht 63.0 in | Wt 137.0 lb

## 2020-07-01 DIAGNOSIS — Z1159 Encounter for screening for other viral diseases: Secondary | ICD-10-CM | POA: Diagnosis not present

## 2020-07-01 DIAGNOSIS — M5442 Lumbago with sciatica, left side: Secondary | ICD-10-CM

## 2020-07-01 DIAGNOSIS — Z114 Encounter for screening for human immunodeficiency virus [HIV]: Secondary | ICD-10-CM | POA: Diagnosis not present

## 2020-07-01 DIAGNOSIS — N760 Acute vaginitis: Secondary | ICD-10-CM | POA: Insufficient documentation

## 2020-07-01 DIAGNOSIS — M545 Low back pain, unspecified: Secondary | ICD-10-CM | POA: Diagnosis not present

## 2020-07-01 DIAGNOSIS — G8929 Other chronic pain: Secondary | ICD-10-CM

## 2020-07-01 DIAGNOSIS — Z0001 Encounter for general adult medical examination with abnormal findings: Secondary | ICD-10-CM

## 2020-07-01 DIAGNOSIS — Z0000 Encounter for general adult medical examination without abnormal findings: Secondary | ICD-10-CM

## 2020-07-01 DIAGNOSIS — D509 Iron deficiency anemia, unspecified: Secondary | ICD-10-CM | POA: Diagnosis not present

## 2020-07-01 DIAGNOSIS — D649 Anemia, unspecified: Secondary | ICD-10-CM | POA: Diagnosis not present

## 2020-07-01 NOTE — Patient Instructions (Addendum)
F/U  In 6 months, call if you need me sooner  CBC, vit D, hep C screen, HIV  Today  Self collected specimen for wet prep and culture today  X ray of low back today due to left hip / buttock pain  Thanks for choosing [facility], we consider it a privelige to serve you.    It is important that you exercise regularly at least 30 minutes 5 times a week. If you develop chest pain, have severe difficulty breathing, or feel very tired, stop exercising immediately and seek medical attention  .[email or web address] Thanks for choosing [facility], we consider it a privelige to serve you.

## 2020-07-01 NOTE — Assessment & Plan Note (Signed)

## 2020-07-02 ENCOUNTER — Encounter: Payer: Self-pay | Admitting: Family Medicine

## 2020-07-02 LAB — CBC WITH DIFFERENTIAL
Basophils Absolute: 0 10*3/uL (ref 0.0–0.2)
Basos: 0 %
EOS (ABSOLUTE): 0.2 10*3/uL (ref 0.0–0.4)
Eos: 3 %
Hematocrit: 34.6 % (ref 34.0–46.6)
Hemoglobin: 10.2 g/dL — ABNORMAL LOW (ref 11.1–15.9)
Immature Grans (Abs): 0 10*3/uL (ref 0.0–0.1)
Immature Granulocytes: 0 %
Lymphocytes Absolute: 3.2 10*3/uL — ABNORMAL HIGH (ref 0.7–3.1)
Lymphs: 46 %
MCH: 22.4 pg — ABNORMAL LOW (ref 26.6–33.0)
MCHC: 29.5 g/dL — ABNORMAL LOW (ref 31.5–35.7)
MCV: 76 fL — ABNORMAL LOW (ref 79–97)
Monocytes Absolute: 0.5 10*3/uL (ref 0.1–0.9)
Monocytes: 7 %
Neutrophils Absolute: 3 10*3/uL (ref 1.4–7.0)
Neutrophils: 44 %
RBC: 4.55 x10E6/uL (ref 3.77–5.28)
RDW: 15 % (ref 11.7–15.4)
WBC: 7 10*3/uL (ref 3.4–10.8)

## 2020-07-02 LAB — HEPATITIS C ANTIBODY: Hep C Virus Ab: <0.1 s/co ratio (ref 0.0–0.9)

## 2020-07-02 LAB — HIV ANTIBODY (ROUTINE TESTING W REFLEX): HIV Screen 4th Generation wRfx: NONREACTIVE

## 2020-07-03 LAB — CERVICOVAGINAL ANCILLARY ONLY
Bacterial Vaginitis (gardnerella): NEGATIVE
Candida Glabrata: NEGATIVE
Candida Vaginitis: NEGATIVE
Chlamydia: NEGATIVE
Comment: NEGATIVE
Comment: NEGATIVE
Comment: NEGATIVE
Comment: NEGATIVE
Comment: NEGATIVE
Comment: NORMAL
Neisseria Gonorrhea: NEGATIVE
Trichomonas: NEGATIVE

## 2020-07-04 LAB — SPECIMEN STATUS REPORT

## 2020-07-04 LAB — IRON: Iron: 71 ug/dL (ref 27–159)

## 2020-07-04 LAB — FERRITIN: Ferritin: 72 ng/mL (ref 15–150)

## 2020-07-06 ENCOUNTER — Encounter: Payer: Self-pay | Admitting: Family Medicine

## 2020-07-06 DIAGNOSIS — Z0000 Encounter for general adult medical examination without abnormal findings: Secondary | ICD-10-CM | POA: Insufficient documentation

## 2020-07-06 NOTE — Assessment & Plan Note (Signed)

## 2020-07-06 NOTE — Progress Notes (Signed)
    Elizabeth Gardner     MRN: [id number]      DOB: 1985-11-04  HPI: Patient is in for annual physical exam. C/o vaginal d/[place] and requests STD testing, denies pelvic pain or odour Recent labs,  are reviewed. Immunization is reviewed , and  updated if needed.   PE: BP 127/72 (BP Location: Right Arm, Patient Position: Sitting, Cuff Size: Normal)   Pulse 74   Temp 97.6 F (36.4 C) (Temporal)   Ht 5\' 3"  (1.6 m)   Wt 137 lb (62.1 kg)   LMP 05/27/2020   SpO2 98%   BMI 24.27 kg/m   Pleasant  female, alert and oriented x 3, in no cardio-pulmonary distress. Afebrile. HEENT No facial trauma or asymetry. Sinuses non tender.  Extra occullar muscles intact.. External ears normal, . Neck: supple, no adenopathy,JVD or thyromegaly.No bruits.  Chest: Clear to ascultation bilaterally.No crackles or wheezes. Non tender to palpation  Breast: Not examined, asymptomatic   Cardiovascular system; Heart sounds normal,  S1 and  S2 ,no S3.  No murmur, or thrill. Apical beat not displaced Peripheral pulses normal.  Abdomen: Soft, non tender, no organomegaly or masses. No bruits. Bowel sounds normal. No guarding, tenderness or rebound.   GU: nopt examined, self collected swabs for STD testing done at visit  Musculoskeletal exam: Full ROM of spine, hips , shoulders and knees. No deformity ,swelling or crepitus noted. No muscle wasting or atrophy.   Neurologic: Cranial nerves 2 to 12 intact. Power, tone ,sensation and reflexes normal throughout. No disturbance in gait. No tremor.  Skin: Intact, no ulceration, erythema , scaling or rash noted. Pigmentation normal throughout  Psych; Normal mood and affect. Judgement and concentration normal   Assessment & Plan:  Encounter for general adult medical examination with abnormal findings Annual exam as documented. Counseling done  re healthy lifestyle involving commitment to 150 minutes exercise per week, heart healthy diet, and  attaining healthy weight.The importance of adequate sleep also discussed. Regular seat belt use and home safety, is also discussed. Changes in health habits are decided on by the patient with goals and time frames  set for achieving them. Immunization and cancer screening needs are specifically addressed at this visit.

## 2020-12-31 ENCOUNTER — Other Ambulatory Visit (HOSPITAL_COMMUNITY)
Admission: RE | Admit: 2020-12-31 | Discharge: 2020-12-31 | Disposition: A | Discharge status: Home / Self Care | Payer: 59 | Source: Ambulatory Visit | Attending: Family Medicine | Admitting: Family Medicine

## 2020-12-31 ENCOUNTER — Encounter: Payer: Self-pay | Admitting: Family Medicine

## 2020-12-31 ENCOUNTER — Ambulatory Visit: Payer: 59 | Admitting: Family Medicine

## 2020-12-31 ENCOUNTER — Other Ambulatory Visit: Payer: Self-pay

## 2020-12-31 VITALS — BP 128/77 | HR 87 | Resp 18 | Ht 64.0 in | Wt 134.1 lb

## 2020-12-31 DIAGNOSIS — N760 Acute vaginitis: Secondary | ICD-10-CM | POA: Diagnosis not present

## 2020-12-31 DIAGNOSIS — Z23 Encounter for immunization: Secondary | ICD-10-CM | POA: Insufficient documentation

## 2020-12-31 DIAGNOSIS — Z202 Contact with and (suspected) exposure to infections with a predominantly sexual mode of transmission: Secondary | ICD-10-CM

## 2020-12-31 NOTE — Patient Instructions (Addendum)
Annual exam with pap mid January call if you need me sooner  Nurse visit for Depo provera on  01/22/2021   Tdap today in office  Specimens sent today for STD testing, GC, Chlamydia, trichomonas, BV and candida  Regular condom use is recommended to reduce risk of STI   Labs this week fasting lipid, cmp and eGFr, TSH, vit D, HIV , RPR and CBC   Pls bring or send info on my chart re vaccines you have had , covid and flu, you need current Covid vaccine  Thanks for choosing [facility], we consider it a privelige to serve you.

## 2020-12-31 NOTE — Assessment & Plan Note (Signed)
Wet prep, GC and chlamydia sent

## 2020-12-31 NOTE — Assessment & Plan Note (Signed)
Re educated re importance of regular condom use, testing for all STD's

## 2020-12-31 NOTE — Assessment & Plan Note (Signed)
After obtaining informed consent, the vaccine is  administered , with no adverse effect noted at the time of administration.  

## 2020-12-31 NOTE — Progress Notes (Signed)
   Elizabeth Gardner     MRN: [id number]      DOB: August 17, 1985   HPI Elizabeth Gardner is here with request for STD testing, she does not use condoms regularly and notes recent increase in odor and discharge. Denies fever, chills, irregular bleed Also wants to rsume Depo, next cycle due in approx 2 weeks, will administer while on cycle  ROS Denies recent fever or chills. Denies sinus pressure, nasal congestion, ear pain or sore throat. Denies chest congestion, productive cough or wheezing. Denies chest pains, palpitations and leg swelling Denies abdominal pain, nausea, vomiting,diarrhea or constipation.   Denies dysuria, frequency, hesitancy or incontinence. Denies joint pain, swelling and limitation in mobility. Denies headaches, seizures, numbness, or tingling. Denies depression, anxiety or insomnia. Denies skin break down or rash.   PE  BP 128/77   Pulse 87   Resp 18   Ht 5\' 4"  (1.626 m)   Wt 134 lb 1.3 oz (60.8 kg)   SpO2 98%   BMI 23.01 kg/m   Patient alert and oriented and in no cardiopulmonary distress.  HEENT: No facial asymmetry, EOMI,     Neck supple .  Chest: Clear to auscultation bilaterally.  CVS: S1, S2 no murmurs, no S3.Regular rate.  ABD: Soft non tender.   Pelvic: white d/c , no cervical motion or adnexal tenderness, uterus not enlarged,  cervix is firm  Ext: No edema  MS: Adequate ROM spine, shoulders, hips and knees.  Skin: Intact, no ulcerations or rash noted.  Psych: Good eye contact, normal affect. Memory intact not anxious or depressed appearing.  CNS: CN 2-12 intact, power,  normal throughout.no focal deficits noted.   Assessment & Plan  Possible exposure to STD Re educated re importance of regular condom use, testing for all STD's   Vulvovaginitis Wet prep, GC and chlamydia sent  Need for Tdap vaccination After obtaining informed consent, the vaccine is  administered , with no adverse effect noted at the time of administration.

## 2021-01-01 ENCOUNTER — Encounter: Payer: Self-pay | Admitting: Family Medicine

## 2021-01-02 ENCOUNTER — Other Ambulatory Visit: Payer: Self-pay

## 2021-01-02 DIAGNOSIS — E559 Vitamin D deficiency, unspecified: Secondary | ICD-10-CM

## 2021-01-02 DIAGNOSIS — D509 Iron deficiency anemia, unspecified: Secondary | ICD-10-CM

## 2021-01-02 DIAGNOSIS — Z13220 Encounter for screening for lipoid disorders: Secondary | ICD-10-CM | POA: Diagnosis not present

## 2021-01-02 DIAGNOSIS — Z202 Contact with and (suspected) exposure to infections with a predominantly sexual mode of transmission: Secondary | ICD-10-CM | POA: Diagnosis not present

## 2021-01-02 LAB — CERVICOVAGINAL ANCILLARY ONLY
Candida Glabrata: NEGATIVE
Candida Vaginitis: NEGATIVE
Chlamydia: NEGATIVE
Comment: NEGATIVE
Comment: NEGATIVE
Comment: NEGATIVE
Comment: NORMAL
Neisseria Gonorrhea: NEGATIVE

## 2021-01-03 ENCOUNTER — Encounter: Payer: Self-pay | Admitting: Family Medicine

## 2021-01-03 ENCOUNTER — Other Ambulatory Visit: Payer: Self-pay | Admitting: Family Medicine

## 2021-01-03 LAB — CBC
Hematocrit: 37 % (ref 34.0–46.6)
Hemoglobin: 11.3 g/dL (ref 11.1–15.9)
MCH: 23.5 pg — ABNORMAL LOW (ref 26.6–33.0)
MCHC: 30.5 g/dL — ABNORMAL LOW (ref 31.5–35.7)
MCV: 77 fL — ABNORMAL LOW (ref 79–97)
Platelets: 269 10*3/uL (ref 150–450)
RBC: 4.8 x10E6/uL (ref 3.77–5.28)
RDW: 15.5 % — ABNORMAL HIGH (ref 11.7–15.4)
WBC: 6.4 10*3/uL (ref 3.4–10.8)

## 2021-01-03 LAB — LIPID PANEL
Chol/HDL Ratio: 2.3 ratio (ref 0.0–4.4)
Cholesterol, Total: 166 mg/dL (ref 100–199)
HDL: 71 mg/dL (ref >39)
LDL Chol Calc (NIH): 85 mg/dL (ref 0–99)
Triglycerides: 46 mg/dL (ref 0–149)
VLDL Cholesterol Cal: 10 mg/dL (ref 5–40)

## 2021-01-03 LAB — CMP14+EGFR
ALT: 12 IU/L (ref 0–32)
AST: 21 IU/L (ref 0–40)
Albumin/Globulin Ratio: 1.8 (ref 1.2–2.2)
Albumin: 4.9 g/dL — ABNORMAL HIGH (ref 3.8–4.8)
Alkaline Phosphatase: 87 IU/L (ref 44–121)
BUN/Creatinine Ratio: 17 (ref 9–23)
BUN: 12 mg/dL (ref 6–20)
Bilirubin Total: 0.7 mg/dL (ref 0.0–1.2)
CO2: 19 mmol/L — ABNORMAL LOW (ref 20–29)
Calcium: 9.8 mg/dL (ref 8.7–10.2)
Chloride: 102 mmol/L (ref 96–106)
Creatinine, Ser: 0.7 mg/dL (ref 0.57–1.00)
Globulin, Total: 2.7 g/dL (ref 1.5–4.5)
Glucose: 78 mg/dL (ref 70–99)
Potassium: 4.9 mmol/L (ref 3.5–5.2)
Sodium: 141 mmol/L (ref 134–144)
Total Protein: 7.6 g/dL (ref 6.0–8.5)
eGFR: 116 mL/min/{1.73_m2} (ref >59)

## 2021-01-03 LAB — VITAMIN D 25 HYDROXY (VIT D DEFICIENCY, FRACTURES): Vit D, 25-Hydroxy: 13.2 ng/mL — ABNORMAL LOW (ref 30.0–100.0)

## 2021-01-03 LAB — HIV ANTIBODY (ROUTINE TESTING W REFLEX): HIV Screen 4th Generation wRfx: NONREACTIVE

## 2021-01-03 LAB — TSH: TSH: 2.01 u[IU]/mL (ref 0.450–4.500)

## 2021-01-03 LAB — RPR: RPR Ser Ql: NONREACTIVE

## 2021-01-03 MED ORDER — ERGOCALCIFEROL 1.25 MG (50000 UT) PO CAPS: 50000.0000 [IU] | ORAL_CAPSULE | ORAL | 2 refills | Status: DC

## 2021-01-22 ENCOUNTER — Ambulatory Visit (INDEPENDENT_AMBULATORY_CARE_PROVIDER_SITE_OTHER): Payer: 59

## 2021-01-22 ENCOUNTER — Other Ambulatory Visit: Payer: Self-pay

## 2021-01-22 DIAGNOSIS — Z308 Encounter for other contraceptive management: Secondary | ICD-10-CM

## 2021-01-22 MED ORDER — MEDROXYPROGESTERONE ACETATE 150 MG/ML IM SUSP
150.0000 mg | Freq: Once | INTRAMUSCULAR | Status: AC
  Administered 2021-01-22: 150 mg via INTRAMUSCULAR

## 2021-04-03 ENCOUNTER — Encounter: Payer: 59 | Admitting: Family Medicine

## 2021-04-04 ENCOUNTER — Encounter: Payer: 59 | Admitting: Family Medicine

## 2021-04-10 ENCOUNTER — Ambulatory Visit: Payer: 59

## 2021-04-10 ENCOUNTER — Other Ambulatory Visit: Payer: Self-pay

## 2021-04-10 DIAGNOSIS — Z308 Encounter for other contraceptive management: Secondary | ICD-10-CM

## 2021-04-10 MED ORDER — MEDROXYPROGESTERONE ACETATE 150 MG/ML IM SUSP
150.0000 mg | Freq: Once | INTRAMUSCULAR | Status: AC
  Administered 2021-04-10: 150 mg via INTRAMUSCULAR

## 2021-06-30 ENCOUNTER — Encounter: Payer: Self-pay | Admitting: Family Medicine

## 2021-07-03 ENCOUNTER — Ambulatory Visit (INDEPENDENT_AMBULATORY_CARE_PROVIDER_SITE_OTHER): Payer: 59

## 2021-07-03 ENCOUNTER — Ambulatory Visit: Payer: 59

## 2021-07-03 DIAGNOSIS — Z308 Encounter for other contraceptive management: Secondary | ICD-10-CM

## 2021-07-03 MED ORDER — MEDROXYPROGESTERONE ACETATE 150 MG/ML IM SUSP
150.0000 mg | Freq: Once | INTRAMUSCULAR | Status: AC
  Administered 2021-07-03: 150 mg via INTRAMUSCULAR

## 2021-08-05 ENCOUNTER — Other Ambulatory Visit (HOSPITAL_COMMUNITY)
Admission: RE | Admit: 2021-08-05 | Discharge: 2021-08-05 | Disposition: A | Discharge status: Home / Self Care | Payer: 59 | Source: Ambulatory Visit | Attending: Family Medicine | Admitting: Family Medicine

## 2021-08-05 ENCOUNTER — Ambulatory Visit (INDEPENDENT_AMBULATORY_CARE_PROVIDER_SITE_OTHER): Payer: 59 | Admitting: Family Medicine

## 2021-08-05 ENCOUNTER — Encounter: Payer: Self-pay | Admitting: Family Medicine

## 2021-08-05 ENCOUNTER — Other Ambulatory Visit (HOSPITAL_COMMUNITY): Payer: Self-pay

## 2021-08-05 VITALS — BP 128/77 | HR 95 | Resp 16 | Ht 66.5 in | Wt 139.4 lb

## 2021-08-05 DIAGNOSIS — Z124 Encounter for screening for malignant neoplasm of cervix: Secondary | ICD-10-CM

## 2021-08-05 DIAGNOSIS — Z0000 Encounter for general adult medical examination without abnormal findings: Secondary | ICD-10-CM

## 2021-08-05 DIAGNOSIS — Z13220 Encounter for screening for lipoid disorders: Secondary | ICD-10-CM

## 2021-08-05 DIAGNOSIS — E559 Vitamin D deficiency, unspecified: Secondary | ICD-10-CM

## 2021-08-05 DIAGNOSIS — N760 Acute vaginitis: Secondary | ICD-10-CM | POA: Insufficient documentation

## 2021-08-05 MED ORDER — ERGOCALCIFEROL 1.25 MG (50000 UT) PO CAPS
50000.0000 [IU] | ORAL_CAPSULE | ORAL | 1 refills | Status: DC
  Filled 2021-08-05: qty 12, 84d supply, fill #0

## 2021-08-05 MED ORDER — POLYETHYLENE GLYCOL 3350 17 G PO PACK
17.0000 g | PACK | Freq: Every day | ORAL | 2 refills | Status: AC
  Filled 2021-08-05: qty 14, 14d supply, fill #0

## 2021-08-05 NOTE — Patient Instructions (Addendum)
F/u IN 6  MONTHS, CALL IF YOU NEED ME SOONER  MTHAMKEDICATION IS SENT TO [facility]  FASTING cbc, LIPID, CMP AND egfr, TsH AND VIT d IN NEXT 1 TO 2 WEEKS PLEASE  It is important that you exercise regularly at least 30 minutes 5 times a week. If you develop chest pain, have severe difficulty breathing, or feel very tired, stop exercising immediately and seek medical attention   Think about what you will eat, plan ahead. Choose " clean, green, fresh or frozen" over canned, processed or packaged foods which are more sugary, salty and fatty. 70 to 75% of food eaten should be vegetables and fruit. Three meals at set times with snacks allowed between meals, but they must be fruit or vegetables. Aim to eat over a 12 hour period , example 7 am to 7 pm, and STOP after  your last meal of the day. Drink water,generally about 64 ounces per day, no other drink is as healthy. Fruit juice is best enjoyed in a healthy way, by EATING the fruit. Thanks for choosing [facility], we consider it a privelige to serve you.

## 2021-08-05 NOTE — Progress Notes (Unsigned)
    Elizabeth Gardner     MRN: [id number]      DOB: [birth year]  HPI: Patient is in for annual physical exam. No other health concerns are expressed or addressed at the visit. Requests STD testing Recent labs,  are reviewed. Immunization is reviewed , and  updated if needed.   PE: BP 128/77   Pulse 95   Resp 16   Ht 5' 6.5" (1.689 m)   Wt 139 lb 6.4 oz (63.2 kg)   SpO2 98%   BMI 22.16 kg/m   Pleasant  female, alert and oriented x 3, in no cardio-pulmonary distress. Afebrile. HEENT No facial trauma or asymetry. Sinuses non tender.  Extra occullar muscles intact.. External ears normal, . Neck: supple, no adenopathy,JVD or thyromegaly.No bruits.  Chest: Clear to ascultation bilaterally.No crackles or wheezes. Non tender to palpation  Breast: No asymetry,no masses or lumps. No tenderness. No nipple discharge or inversion. No axillary or supraclavicular adenopathy  Cardiovascular system; Heart sounds normal,  S1 and  S2 ,no S3.  No murmur, or thrill. Apical beat not displaced Peripheral pulses normal.  Abdomen: Soft, non tender, no organomegaly or masses. No bruits. Bowel sounds normal. No guarding, tenderness or rebound.   GU: External genitalia normal female genitalia , normal female distribution of hair. No lesions. Urethral meatus normal in size, no  Prolapse, no lesions visibly  Present. Bladder non tender. Vagina pink and moist , with no visible lesions , discharge present . Adequate pelvic support no  cystocele or rectocele noted Cervix pink and appears healthy, no lesions or ulcerations noted, bloody  discharge noted from os, os is stenotic Uterus normal size, no adnexal masses, no cervical motion or adnexal tenderness.   Musculoskeletal exam: Full ROM of spine, hips , shoulders and knees. No deformity ,swelling or crepitus noted. No muscle wasting or atrophy.   Neurologic: Cranial nerves 2 to 12 intact. Power, tone ,sensation and reflexes normal  throughout. No disturbance in gait. No tremor.  Skin: Intact, no ulceration, erythema , scaling or rash noted. Pigmentation normal throughout  Psych; Normal mood and affect. Judgement and concentration normal   Assessment & Plan:  Annual physical exam Annual exam as documented. Counseling done  re healthy lifestyle involving commitment to 150 minutes exercise per week, heart healthy diet, and attaining healthy weight.The importance of adequate sleep also discussed. Regular seat belt use and home safety, is also discussed. Changes in health habits are decided on by the patient with goals and time frames  set for achieving them. Immunization and cancer screening needs are specifically addressed at this visit.   Vulvovaginitis Specimens sent for STD testing, inportance of regular condom use is discussed

## 2021-08-05 NOTE — Assessment & Plan Note (Signed)

## 2021-08-06 ENCOUNTER — Encounter: Payer: Self-pay | Admitting: Family Medicine

## 2021-08-06 NOTE — Assessment & Plan Note (Signed)
Specimens sent for STD testing, inportance of regular condom use is discussed

## 2021-08-07 ENCOUNTER — Other Ambulatory Visit (HOSPITAL_COMMUNITY): Payer: Self-pay

## 2021-08-07 DIAGNOSIS — Z13220 Encounter for screening for lipoid disorders: Secondary | ICD-10-CM | POA: Diagnosis not present

## 2021-08-07 DIAGNOSIS — E559 Vitamin D deficiency, unspecified: Secondary | ICD-10-CM | POA: Diagnosis not present

## 2021-08-07 DIAGNOSIS — D539 Nutritional anemia, unspecified: Secondary | ICD-10-CM | POA: Diagnosis not present

## 2021-08-07 LAB — CERVICOVAGINAL ANCILLARY ONLY
Bacterial Vaginitis (gardnerella): NEGATIVE
Candida Glabrata: NEGATIVE
Candida Vaginitis: NEGATIVE
Chlamydia: NEGATIVE
Comment: NEGATIVE
Comment: NEGATIVE
Comment: NEGATIVE
Comment: NEGATIVE
Comment: NEGATIVE
Comment: NORMAL
Neisseria Gonorrhea: NEGATIVE
Trichomonas: NEGATIVE

## 2021-08-08 LAB — LIPID PANEL
Chol/HDL Ratio: 2.7 ratio (ref 0.0–4.4)
Cholesterol, Total: 159 mg/dL (ref 100–199)
HDL: 60 mg/dL (ref >39)
LDL Chol Calc (NIH): 89 mg/dL (ref 0–99)
Triglycerides: 46 mg/dL (ref 0–149)
VLDL Cholesterol Cal: 10 mg/dL (ref 5–40)

## 2021-08-08 LAB — CBC
Hematocrit: 35.5 % (ref 34.0–46.6)
Hemoglobin: 11 g/dL — ABNORMAL LOW (ref 11.1–15.9)
MCH: 23.1 pg — ABNORMAL LOW (ref 26.6–33.0)
MCHC: 31 g/dL — ABNORMAL LOW (ref 31.5–35.7)
MCV: 75 fL — ABNORMAL LOW (ref 79–97)
Platelets: 290 10*3/uL (ref 150–450)
RBC: 4.76 x10E6/uL (ref 3.77–5.28)
RDW: 15.4 % (ref 11.7–15.4)
WBC: 6 10*3/uL (ref 3.4–10.8)

## 2021-08-08 LAB — CMP14+EGFR
ALT: 13 IU/L (ref 0–32)
AST: 16 IU/L (ref 0–40)
Albumin/Globulin Ratio: 1.7 (ref 1.2–2.2)
Albumin: 4.5 g/dL (ref 3.8–4.8)
Alkaline Phosphatase: 71 IU/L (ref 44–121)
BUN/Creatinine Ratio: 14 (ref 9–23)
BUN: 10 mg/dL (ref 6–20)
Bilirubin Total: 0.4 mg/dL (ref 0.0–1.2)
CO2: 21 mmol/L (ref 20–29)
Calcium: 9.6 mg/dL (ref 8.7–10.2)
Chloride: 104 mmol/L (ref 96–106)
Creatinine, Ser: 0.73 mg/dL (ref 0.57–1.00)
Globulin, Total: 2.7 g/dL (ref 1.5–4.5)
Glucose: 92 mg/dL (ref 70–99)
Potassium: 5.1 mmol/L (ref 3.5–5.2)
Sodium: 141 mmol/L (ref 134–144)
Total Protein: 7.2 g/dL (ref 6.0–8.5)
eGFR: 110 mL/min/{1.73_m2} (ref >59)

## 2021-08-08 LAB — VITAMIN D 25 HYDROXY (VIT D DEFICIENCY, FRACTURES): Vit D, 25-Hydroxy: 43.3 ng/mL (ref 30.0–100.0)

## 2021-08-08 LAB — TSH: TSH: 2.46 u[IU]/mL (ref 0.450–4.500)

## 2021-08-11 LAB — CYTOLOGY - PAP
Adequacy: ABSENT
Comment: NEGATIVE
Diagnosis: NEGATIVE
High risk HPV: NEGATIVE

## 2021-08-14 LAB — FERRITIN: Ferritin: 23 ng/mL (ref 15–150)

## 2021-08-14 LAB — IRON: Iron: 72 ug/dL (ref 27–159)

## 2021-08-14 LAB — SPECIMEN STATUS REPORT

## 2021-10-01 ENCOUNTER — Encounter: Payer: Self-pay | Admitting: Orthopaedic Surgery

## 2021-10-01 ENCOUNTER — Ambulatory Visit (INDEPENDENT_AMBULATORY_CARE_PROVIDER_SITE_OTHER): Payer: 59

## 2021-10-01 ENCOUNTER — Ambulatory Visit (INDEPENDENT_AMBULATORY_CARE_PROVIDER_SITE_OTHER): Payer: 59 | Admitting: Orthopaedic Surgery

## 2021-10-01 ENCOUNTER — Other Ambulatory Visit (HOSPITAL_COMMUNITY): Payer: Self-pay

## 2021-10-01 VITALS — BP 118/77 | HR 82 | Ht 66.5 in | Wt 139.2 lb

## 2021-10-01 DIAGNOSIS — M654 Radial styloid tenosynovitis [de Quervain]: Secondary | ICD-10-CM

## 2021-10-01 DIAGNOSIS — Z308 Encounter for other contraceptive management: Secondary | ICD-10-CM | POA: Diagnosis not present

## 2021-10-01 MED ORDER — NAPROXEN 500 MG PO TABS
500.0000 mg | ORAL_TABLET | Freq: Two times a day (BID) | ORAL | 5 refills | Status: DC
  Filled 2021-10-01: qty 60, 30d supply, fill #0

## 2021-10-01 MED ORDER — MEDROXYPROGESTERONE ACETATE 150 MG/ML IM SUSP
150.0000 mg | Freq: Once | INTRAMUSCULAR | Status: AC
  Administered 2021-10-01: 150 mg via INTRAMUSCULAR

## 2021-10-01 NOTE — Progress Notes (Signed)
Subjective:    Patient ID: Elizabeth Gardner, female    DOB: 29-Dec-1985, 36 y.o.   MRN: [id number]  HPI She has had first extensor compartment pain on the left wrist over the last month, getting worse.  She has no trauma, no redness, no untoward activity.  She has no numbness.  She has tried heat, ice, rubs.   Review of Systems  Constitutional:  Positive for activity change.  Musculoskeletal:  Positive for arthralgias and myalgias.  All other systems reviewed and are negative. For Review of Systems, all other systems reviewed and are negative.  The following is a summary of the past history medically, past history surgically, known current medicines, social history and family history.  This information is gathered electronically by the computer from prior information and documentation.  I review this each visit and have found including this information at this point in the chart is beneficial and informative.   Past Medical History:  Diagnosis Date   General counseling for prescription of oral contraceptives    GERD (gastroesophageal reflux disease) 05/26/2016   Grief 02/12/2019   Other general counseling and advice for contraceptive management     Past Surgical History:  Procedure Laterality Date   BACK SURGERY     for scoliosis     Current Outpatient Medications on File Prior to Visit  Medication Sig Dispense Refill   ergocalciferol (VITAMIN D2) 1.25 MG (50000 UT) capsule Take 1 capsule (50,000 Units total) by mouth once a week. 12 capsule 1   ferrous sulfate 325 (65 FE) MG tablet Take 325 mg by mouth daily with breakfast.     polyethylene glycol (MIRALAX) 17 g packet Take 17 g by mouth daily. 100 each 2   No current facility-administered medications on file prior to visit.    Social History   Socioeconomic History   Marital status: Single    Spouse name: Not on file   Number of children: 1   Years of education: Not on file   Highest education level: Not on file   Occupational History    Employer: [employer]  Tobacco Use   Smoking status: Never   Smokeless tobacco: Never  Substance and Sexual Activity   Alcohol use: No   Drug use: No   Sexual activity: Not on file  Other Topics Concern   Not on file  Social History Narrative   Not on file   Social Determinants of Health   Financial Resource Strain: Not on file  Food Insecurity: Not on file  Transportation Needs: Not on file  Physical Activity: Not on file  Stress: Not on file  Social Connections: Not on file  Intimate Partner Violence: Not on file    Family History  Problem Relation Age of Onset   Hypertension Mother     BP 118/77   Pulse 82   Ht 5' 6.5" (1.689 m)   Wt 139 lb 3.2 oz (63.1 kg)   BMI 22.13 kg/m   Body mass index is 22.13 kg/m.      Objective:   Physical Exam Vitals and nursing note reviewed. Exam conducted with a chaperone present.  Constitutional:      Appearance: She is well-developed.  HENT:     Head: Normocephalic and atraumatic.  Eyes:     Conjunctiva/sclera: Conjunctivae normal.     Pupils: Pupils are equal, round, and reactive to light.  Cardiovascular:     Rate and Rhythm: Normal rate and regular rhythm.  Pulmonary:  Effort: Pulmonary effort is normal.  Abdominal:     Palpations: Abdomen is soft.  Musculoskeletal:       Hands:     Cervical back: Normal range of motion and neck supple.  Skin:    General: Skin is warm and dry.  Neurological:     Mental Status: She is alert and oriented to person, place, and time.     Cranial Nerves: No cranial nerve deficit.     Motor: No abnormal muscle tone.     Coordination: Coordination normal.     Deep Tendon Reflexes: Reflexes are normal and symmetric. Reflexes normal.  Psychiatric:        Behavior: Behavior normal.        Thought Content: Thought content normal.        Judgment: Judgment normal.           Assessment & Plan:   Encounter Diagnosis  Name Primary?   Radial styloid  tenosynovitis (de quervain) Yes   Procedure note: After permission from patient, the first extensor compartment of the left wrist was prepped.  I injected 1 % xylocaine plain and 1 cc DepoMedrol 40 into the first extensor compartment by sterile technique tolerated well.  I have given her a thumb splint.  Return in two weeks.  I will call in naprosyn 500 po bid.  Call if any problem.  Precautions discussed.  Electronically Signed Sanjuana Kava, MD 7/26/20238:45 AM

## 2021-10-15 ENCOUNTER — Encounter: Payer: Self-pay | Admitting: Orthopaedic Surgery

## 2021-10-15 ENCOUNTER — Ambulatory Visit (INDEPENDENT_AMBULATORY_CARE_PROVIDER_SITE_OTHER): Payer: 59 | Admitting: Orthopaedic Surgery

## 2021-10-15 DIAGNOSIS — M654 Radial styloid tenosynovitis [de Quervain]: Secondary | ICD-10-CM

## 2021-10-15 NOTE — Progress Notes (Signed)
I am better  Her Elizabeth Gardner is improved on the left.  She has been using the splint.  The injection helped.  She has some pain but not much, she has no swelling.  The first extensor compartment on the left is not tender today and ROM is good.  Elizabeth Gardner is negative  NV intact.  Encounter Diagnosis  Name Primary?   Radial styloid tenosynovitis (de quervain) Yes   Gradually come out of the splint.  Return in one month.  If doing well, call and cancel.  Call if any problem.  Precautions discussed.  Electronically Signed Sanjuana Kava, MD 8/9/20238:07 AM

## 2021-11-12 ENCOUNTER — Ambulatory Visit: Payer: 59 | Admitting: Orthopaedic Surgery

## 2022-01-01 ENCOUNTER — Encounter: Payer: 59 | Admitting: Family Medicine

## 2022-01-02 ENCOUNTER — Ambulatory Visit (INDEPENDENT_AMBULATORY_CARE_PROVIDER_SITE_OTHER): Payer: 59

## 2022-01-02 DIAGNOSIS — Z308 Encounter for other contraceptive management: Secondary | ICD-10-CM

## 2022-01-02 MED ORDER — MEDROXYPROGESTERONE ACETATE 150 MG/ML IM SUSP
150.0000 mg | Freq: Once | INTRAMUSCULAR | Status: AC
  Administered 2022-01-02: 150 mg via INTRAMUSCULAR

## 2022-02-05 ENCOUNTER — Encounter: Payer: Self-pay | Admitting: Family Medicine

## 2022-02-05 ENCOUNTER — Ambulatory Visit: Payer: 59 | Admitting: Family Medicine

## 2022-02-05 VITALS — BP 115/73 | HR 92 | Ht 65.0 in | Wt 141.0 lb

## 2022-02-05 DIAGNOSIS — Z72821 Inadequate sleep hygiene: Secondary | ICD-10-CM

## 2022-02-05 DIAGNOSIS — D500 Iron deficiency anemia secondary to blood loss (chronic): Secondary | ICD-10-CM | POA: Diagnosis not present

## 2022-02-05 NOTE — Assessment & Plan Note (Signed)
Corrected and no menses on Depo

## 2022-02-05 NOTE — Assessment & Plan Note (Signed)
4 month history,getting on avg 5 hrs of sleep due to poor habits, this is discussed and she will change behavior

## 2022-02-05 NOTE — Progress Notes (Signed)
   Elizabeth Gardner     MRN: [id number]      DOB: [birth year]   HPI Elizabeth Gardner is here for follow up Doing well, states however poor sleep awakens at 3 am every morning and unable to go back to sleep x 4 months, notes however that on checking it when she awakens and getting on tic tocwhen she sleeps with her friend with no TV on , she sleeps through the night, also  in her own home keeps on the bedside light and has her phone which she start following tic tok when she awakens. Realizes while discussing it that these are all bad habits which interfere with good sleep and will change tem , no medication desired or indicated No regular exercise and she needs to start this ROS Denies recent fever or chills. Denies sinus pressure, nasal congestion, ear pain or sore throat. Denies chest congestion, productive cough or wheezing. Denies chest pains, palpitations and leg swelling Denies abdominal pain, nausea, vomiting,diarrhea or constipation.   Denies dysuria, frequency, hesitancy or incontinence. Denies joint pain, swelling and limitation in mobility. Denies headaches, seizures, numbness, or tingling. . Denies skin break down or rash.   PE  BP 115/73 (BP Location: Left Arm, Patient Position: Sitting, Cuff Size: Normal)   Pulse 92   Ht '5\' 5"'$  (1.651 m)   Wt 141 lb (64 kg)   SpO2 98%   BMI 23.46 kg/m   Patient alert and oriented and in no cardiopulmonary distress.  HEENT: No facial asymmetry, EOMI,     Neck supple .  Chest: Clear to auscultation bilaterally.  CVS: S1, S2 no murmurs, no S3.Regular rate.  Ext: No edema  MS: Adequate ROM spine, shoulders, hips and knees.  Skin: Intact, no ulcerations or rash noted.  Psych: Good eye contact, normal affect. Memory intact not anxious or depressed appearing.  CNS: CN 2-12 intact, power,  normal throughout.no focal deficits noted.   Assessment & Plan  Poor sleep hygiene 4 month history,getting on avg 5 hrs of sleep due to poor habits,  this is discussed and she will change behavior  IDA (iron deficiency anemia) Corrected and no menses on Depo

## 2022-02-05 NOTE — Patient Instructions (Signed)
Annual  exam in 6 months, call if you need me sooner  No light , no sound no phone when going to sleep at 10 pm, aim to get 6 to 8 hrs of sleep every night.  Thanks for choosing [facility], we consider it a privelige to serve you.

## 2022-03-23 ENCOUNTER — Encounter: Payer: Self-pay | Admitting: Family Medicine

## 2022-04-06 ENCOUNTER — Ambulatory Visit (INDEPENDENT_AMBULATORY_CARE_PROVIDER_SITE_OTHER): Payer: 59

## 2022-04-06 DIAGNOSIS — Z308 Encounter for other contraceptive management: Secondary | ICD-10-CM | POA: Diagnosis not present

## 2022-04-06 MED ORDER — MEDROXYPROGESTERONE ACETATE 150 MG/ML IM SUSP
150.0000 mg | Freq: Once | INTRAMUSCULAR | Status: AC
  Administered 2022-04-06: 150 mg via INTRAMUSCULAR

## 2022-05-07 ENCOUNTER — Encounter: Payer: Self-pay | Admitting: Radiology

## 2022-05-19 IMAGING — DX DG LUMBAR SPINE COMPLETE 4+V
5 series · 5 of 5 positions shown · non-contrast
Comparison: None.

CLINICAL DATA: Left buttock pain.

EXAM:
LUMBAR SPINE - COMPLETE 4+ VIEW

[l-spine ap]
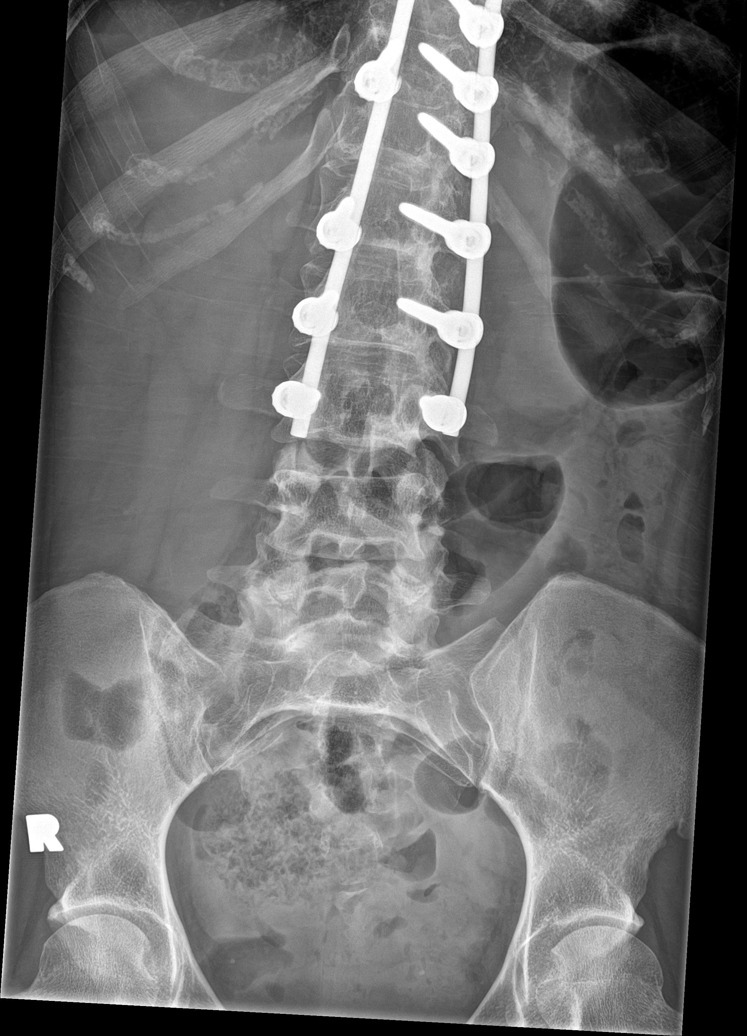

[l-spine obl (1 of 2)]
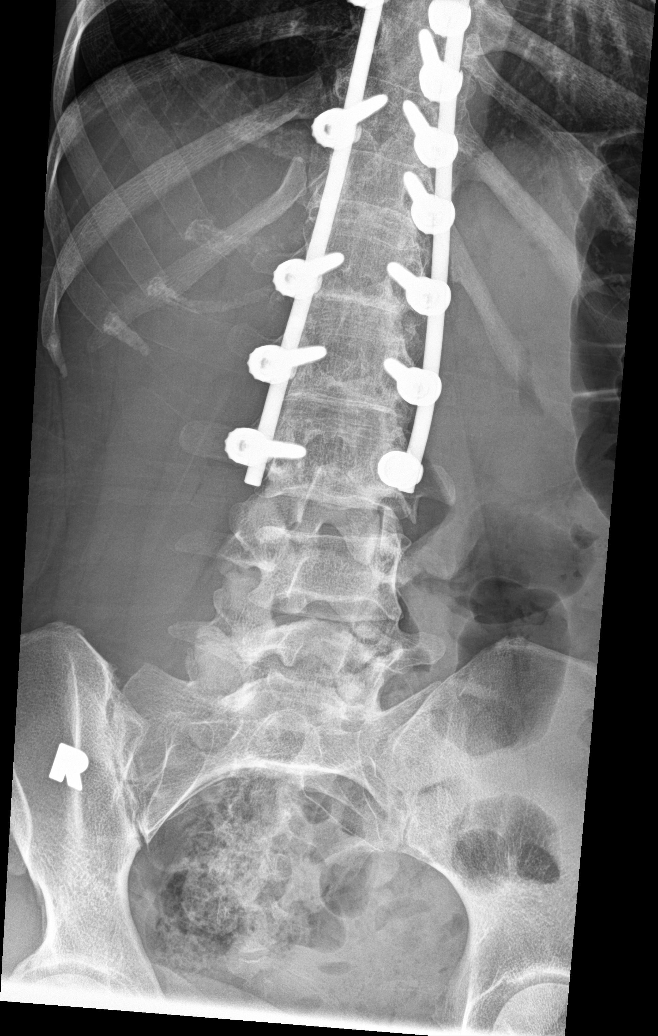

[l-spine obl (2 of 2)]
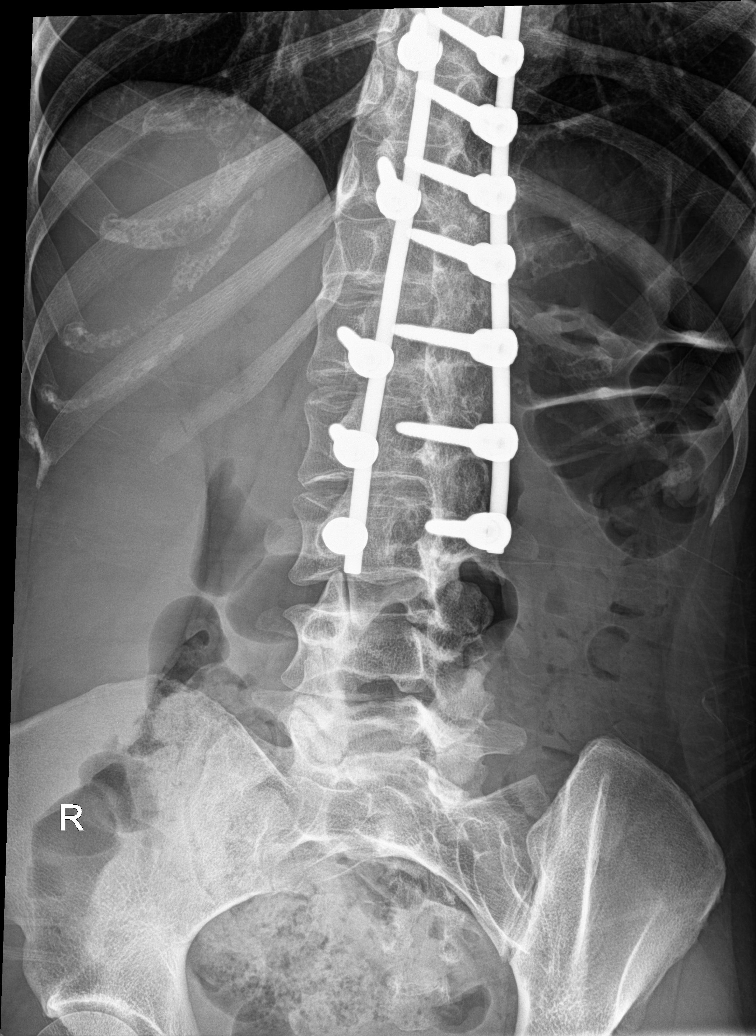

[l-spine lat]
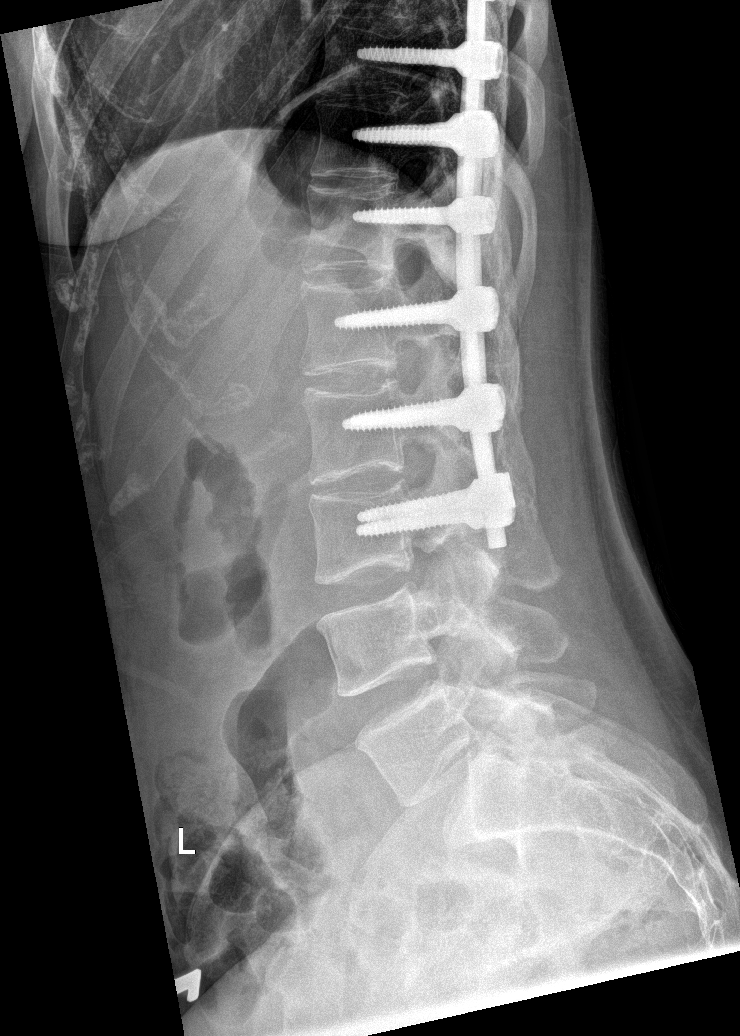

[l-spine spot]
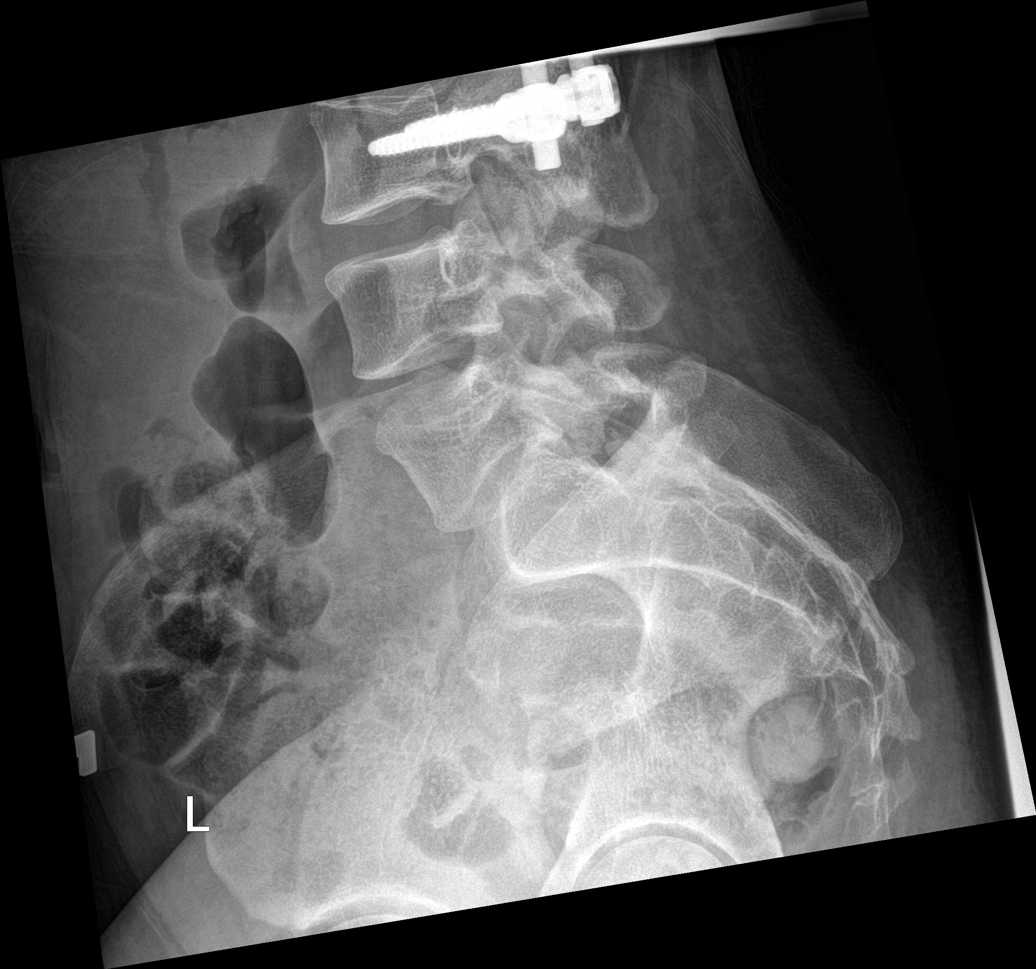

[5 of 5 positions shown; findings below may reference images not displayed]

FINDINGS: Pedicle rods and screws seen in the lower thoracic and upper lumbar
spine. Visualized hardware is in good position.

No malalignment. No fracture. No significant degenerative disc
disease. No other acute abnormalities.
IMPRESSION: Surgical hardware is above.

No other abnormalities.

## 2022-07-06 ENCOUNTER — Ambulatory Visit (INDEPENDENT_AMBULATORY_CARE_PROVIDER_SITE_OTHER): Payer: 59

## 2022-07-06 DIAGNOSIS — Z308 Encounter for other contraceptive management: Secondary | ICD-10-CM | POA: Diagnosis not present

## 2022-07-06 MED ORDER — MEDROXYPROGESTERONE ACETATE 150 MG/ML IM SUSP
150.0000 mg | Freq: Once | INTRAMUSCULAR | Status: AC
  Administered 2022-07-06: 150 mg via INTRAMUSCULAR

## 2022-08-19 ENCOUNTER — Encounter: Payer: Self-pay | Admitting: Family Medicine

## 2022-08-19 ENCOUNTER — Ambulatory Visit (INDEPENDENT_AMBULATORY_CARE_PROVIDER_SITE_OTHER): Payer: 59 | Admitting: Family Medicine

## 2022-08-19 VITALS — BP 118/75 | HR 82 | Resp 16 | Ht 66.0 in | Wt 149.0 lb

## 2022-08-19 DIAGNOSIS — Z13220 Encounter for screening for lipoid disorders: Secondary | ICD-10-CM

## 2022-08-19 DIAGNOSIS — D500 Iron deficiency anemia secondary to blood loss (chronic): Secondary | ICD-10-CM | POA: Diagnosis not present

## 2022-08-19 DIAGNOSIS — Z0000 Encounter for general adult medical examination without abnormal findings: Secondary | ICD-10-CM | POA: Diagnosis not present

## 2022-08-19 DIAGNOSIS — E559 Vitamin D deficiency, unspecified: Secondary | ICD-10-CM | POA: Diagnosis not present

## 2022-08-19 NOTE — Patient Instructions (Addendum)
F/U in January , call if you need me sooner  Fasting CBC, lipid, cmp and eGFr, iron and ferritin, TSH and vit D this week  Increase vegetable and fruit and  reduce sugar, sweets and starchy foods  It is important that you exercise regularly at least 30 minutes 5 times a week. If you develop chest pain, have severe difficulty breathing, or feel very tired, stop exercising immediately and seek medical attention    Thanks for choosing [facility], we consider it a privelige to serve you.

## 2022-08-21 ENCOUNTER — Encounter: Payer: Self-pay | Admitting: Family Medicine

## 2022-08-21 NOTE — Assessment & Plan Note (Signed)

## 2022-08-21 NOTE — Progress Notes (Signed)
    Elizabeth Gardner     MRN: [id number]      DOB: Sep 30, 1985  Chief Complaint  Patient presents with   Annual Exam    No pap    HPI: Patient is in for annual physical exam. No other health concerns are expressed or addressed at the visit.   PE: BP 118/75   Pulse 82   Resp 16   Ht 5\' 6"  (1.676 m)   Wt 149 lb (67.6 kg)   SpO2 96%   BMI 24.05 kg/m   Pleasant  female, alert and oriented x 3, in no cardio-pulmonary distress. Afebrile. HEENT No facial trauma or asymetry. Sinuses non tender.  Extra occullar muscles intact.. External ears normal, . Neck: supple, no adenopathy,JVD or thyromegaly.No bruits.  Chest: Clear to ascultation bilaterally.No crackles or wheezes. Non tender to palpation  Cardiovascular system; Heart sounds normal,  S1 and  S2 ,no S3.  No murmur, or thrill. Apical beat not displaced Peripheral pulses normal.  Abdomen: Soft, non tender, no organomegaly or masses. No bruits. Bowel sounds normal. No guarding, tenderness or rebound.    Musculoskeletal exam: Full ROM of spine, hips , shoulders and knees. No deformity ,swelling or crepitus noted. No muscle wasting or atrophy.   Neurologic: Cranial nerves 2 to 12 intact. Power, tone ,sensation and reflexes normal throughout. No disturbance in gait. No tremor.  Skin: Intact, no ulceration, erythema , scaling or rash noted. Pigmentation normal throughout  Psych; Normal mood and affect. Judgement and concentration normal   Assessment & Plan:  Encounter for annual physical exam Annual exam as documented. Counseling done  re healthy lifestyle involving commitment to 150 minutes exercise per week, heart healthy diet, and attaining healthy weight.The importance of adequate sleep also discussed. Regular seat belt use and home safety, is also discussed. Changes in health habits are decided on by the patient with goals and time frames  set for achieving them. Immunization and cancer screening needs  are specifically addressed at this visit.

## 2022-08-28 DIAGNOSIS — Z13220 Encounter for screening for lipoid disorders: Secondary | ICD-10-CM | POA: Diagnosis not present

## 2022-08-28 DIAGNOSIS — D500 Iron deficiency anemia secondary to blood loss (chronic): Secondary | ICD-10-CM | POA: Diagnosis not present

## 2022-08-28 DIAGNOSIS — E559 Vitamin D deficiency, unspecified: Secondary | ICD-10-CM | POA: Diagnosis not present

## 2022-08-29 LAB — LIPID PANEL
Chol/HDL Ratio: 2.8 ratio (ref 0.0–4.4)
Cholesterol, Total: 146 mg/dL (ref 100–199)
HDL: 52 mg/dL (ref >39)
LDL Chol Calc (NIH): 85 mg/dL (ref 0–99)
Triglycerides: 40 mg/dL (ref 0–149)
VLDL Cholesterol Cal: 9 mg/dL (ref 5–40)

## 2022-08-29 LAB — CBC
Hematocrit: 36.3 % (ref 34.0–46.6)
Hemoglobin: 11.6 g/dL (ref 11.1–15.9)
MCH: 23.6 pg — ABNORMAL LOW (ref 26.6–33.0)
MCHC: 32 g/dL (ref 31.5–35.7)
MCV: 74 fL — ABNORMAL LOW (ref 79–97)
Platelets: 290 10*3/uL (ref 150–450)
RBC: 4.92 x10E6/uL (ref 3.77–5.28)
RDW: 14.7 % (ref 11.7–15.4)
WBC: 5.6 10*3/uL (ref 3.4–10.8)

## 2022-08-29 LAB — TSH: TSH: 2.22 u[IU]/mL (ref 0.450–4.500)

## 2022-08-29 LAB — CMP14+EGFR
ALT: 14 IU/L (ref 0–32)
AST: 18 IU/L (ref 0–40)
Albumin: 4.5 g/dL (ref 3.9–4.9)
Alkaline Phosphatase: 79 IU/L (ref 44–121)
BUN/Creatinine Ratio: 11 (ref 9–23)
BUN: 8 mg/dL (ref 6–20)
Bilirubin Total: 0.4 mg/dL (ref 0.0–1.2)
CO2: 20 mmol/L (ref 20–29)
Calcium: 9.6 mg/dL (ref 8.7–10.2)
Chloride: 106 mmol/L (ref 96–106)
Creatinine, Ser: 0.71 mg/dL (ref 0.57–1.00)
Globulin, Total: 2.9 g/dL (ref 1.5–4.5)
Glucose: 89 mg/dL (ref 70–99)
Potassium: 4.7 mmol/L (ref 3.5–5.2)
Sodium: 140 mmol/L (ref 134–144)
Total Protein: 7.4 g/dL (ref 6.0–8.5)
eGFR: 113 mL/min/{1.73_m2} (ref >59)

## 2022-08-29 LAB — VITAMIN D 25 HYDROXY (VIT D DEFICIENCY, FRACTURES): Vit D, 25-Hydroxy: 31.5 ng/mL (ref 30.0–100.0)

## 2022-08-29 LAB — FERRITIN: Ferritin: 66 ng/mL (ref 15–150)

## 2022-08-29 LAB — IRON: Iron: 83 ug/dL (ref 27–159)

## 2022-10-01 ENCOUNTER — Encounter: Payer: Self-pay | Admitting: Family Medicine

## 2022-10-05 ENCOUNTER — Ambulatory Visit (INDEPENDENT_AMBULATORY_CARE_PROVIDER_SITE_OTHER): Payer: 59

## 2022-10-05 DIAGNOSIS — Z308 Encounter for other contraceptive management: Secondary | ICD-10-CM

## 2022-10-05 LAB — POCT URINE PREGNANCY: Preg Test, Ur: NEGATIVE

## 2022-10-05 MED ORDER — NORGESTIM-ETH ESTRAD TRIPHASIC 0.18/0.215/0.25 MG-25 MCG PO TABS: 1.0000 | ORAL_TABLET | Freq: Every day | ORAL | 11 refills | Status: AC

## 2022-12-17 ENCOUNTER — Encounter: Payer: Self-pay | Admitting: Family Medicine

## 2023-02-17 ENCOUNTER — Encounter: Payer: Self-pay | Admitting: Family Medicine

## 2023-03-23 ENCOUNTER — Encounter: Payer: Self-pay | Admitting: Family Medicine

## 2023-03-23 ENCOUNTER — Ambulatory Visit: Payer: 59 | Admitting: Family Medicine

## 2023-03-23 VITALS — BP 127/75 | HR 91 | Ht 66.0 in | Wt 141.0 lb

## 2023-03-23 DIAGNOSIS — E559 Vitamin D deficiency, unspecified: Secondary | ICD-10-CM

## 2023-03-23 DIAGNOSIS — D500 Iron deficiency anemia secondary to blood loss (chronic): Secondary | ICD-10-CM

## 2023-03-23 DIAGNOSIS — F5101 Primary insomnia: Secondary | ICD-10-CM | POA: Diagnosis not present

## 2023-03-23 DIAGNOSIS — Z13220 Encounter for screening for lipoid disorders: Secondary | ICD-10-CM | POA: Diagnosis not present

## 2023-03-23 DIAGNOSIS — Z72821 Inadequate sleep hygiene: Secondary | ICD-10-CM

## 2023-03-23 DIAGNOSIS — J302 Other seasonal allergic rhinitis: Secondary | ICD-10-CM

## 2023-03-23 MED ORDER — AZELASTINE HCL 0.1 % NA SOLN: 2.0000 | Freq: Two times a day (BID) | NASAL | 12 refills | Status: AC

## 2023-03-23 MED ORDER — HYDROXYZINE HCL 10 MG PO TABS: ORAL_TABLET | ORAL | 5 refills | Status: AC

## 2023-03-23 NOTE — Patient Instructions (Addendum)
 Annual with pap in June, call if you need me sooner  Please work on sleep  New for allergy and sleep  is hydroxyzine  , turn off tv and phone and light  New for allergies is Astelin  spray   Fasting CBC, lipid,cmp and eGFR, TSH and vit D 5 days before next visit.   It is important that you exercise regularly at least 30 minutes 5 times a week. If you develop chest pain, have severe difficulty breathing, or feel very tired, stop exercising immediately and seek medical attention   Think about what you will eat, plan ahead. Choose  clean, green, fresh or frozen over canned, processed or packaged foods which are more sugary, salty and fatty. 70 to 75% of food eaten should be vegetables and fruit. Three meals at set times with snacks allowed between meals, but they must be fruit or vegetables. Aim to eat over a 12 hour period , example 7 am to 7 pm, and STOP after  your last meal of the day. Drink water,generally about 64 ounces per day, no other drink is as healthy. Fruit juice is best enjoyed in a healthy way, by EATING the fruit.  Thanks for choosing [facility], we consider it a privelige to serve you.

## 2023-03-29 DIAGNOSIS — G4700 Insomnia, unspecified: Secondary | ICD-10-CM | POA: Insufficient documentation

## 2023-03-29 NOTE — Assessment & Plan Note (Signed)
Updated lab needed at/ before next visit.   

## 2023-03-29 NOTE — Assessment & Plan Note (Signed)
Unchanged, states she will work on this as the problem is increasing, tired and reduced productivity. Hygiene reviewed with proposed changes

## 2023-03-29 NOTE — Assessment & Plan Note (Signed)
Difficulty attaining and maintaining sleep. Sleep hygiene reviewed and written information offered also. Prescription sent for  medication needed.

## 2023-03-29 NOTE — Assessment & Plan Note (Signed)
Currently uncontrolled which is common between Fall and Winter, Astelin prescribed

## 2023-03-29 NOTE — Progress Notes (Signed)
   Elizabeth Gardner     MRN: [id number]      DOB: [birth year]  Chief Complaint  Patient presents with   Follow-up    Follow up    HPI Elizabeth Gardner is here for follow up and re-evaluation of chronic medical conditions, medication management and review of any available recent lab and radiology data.  Preventive health is updated, specifically  Cancer screening and Immunization.   Questions or concerns regarding consultations or procedures which the PT has had in the interim are  addressed. The PT denies any adverse reactions to current medications since the last visit.  C/o increased nasal congestion and post nasal drainage, no fever , chills or discolored drainage Ongoing poor sleep, poor sleep habits and chroonic fatigue ROS Denies recent fever or chills. Denies  ear pain or sore throat. Denies chest congestion, productive cough or wheezing. Denies chest pains, palpitations and leg swelling Denies abdominal pain, nausea, vomiting,diarrhea or constipation.   Denies dysuria, frequency, hesitancy or incontinence. Denies joint pain, swelling and limitation in mobility. Denies headaches, seizures, numbness, or tingling. Denies depression, anxiety or insomnia. Denies skin break down or rash.   PE  BP 127/75 (BP Location: Right Arm, Patient Position: Sitting, Cuff Size: Normal)   Pulse 91   Ht 5' 6 (1.676 m)   Wt 141 lb 0.6 oz (64 kg)   SpO2 97%   BMI 22.76 kg/m   Patient alert and oriented and in no cardiopulmonary distress.  HEENT: No facial asymmetry, EOMI,     Neck supple .No sinus tenderness  Chest: Clear to auscultation bilaterally.  CVS: S1, S2 no murmurs, no S3.Regular rate.  ABD: Soft non tender.   Ext: No edema  MS: Adequate ROM spine, shoulders, hips and knees.  Skin: Intact, no ulcerations or rash noted.  Psych: Good eye contact, normal affect. Memory intact not anxious or depressed appearing.  CNS: CN 2-12 intact, power,  normal throughout.no focal deficits  noted.   Assessment & Plan  Poor sleep hygiene Unchanged, states she will work on this as the problem is increasing, tired and reduced productivity. Hygiene reviewed with proposed changes  Insomnia Difficulty attaining and maintaining sleep Sleep hygiene reviewed and written information offered also. Prescription sent for  medication needed.   Vitamin D  deficiency Updated lab needed at/ before next visit.   IDA (iron deficiency anemia) Updated lab needed at/ before next visit.   Allergic rhinitis Currently uncontrolled which is common between Fall and Winter, Astelin  prescribed

## 2023-08-10 ENCOUNTER — Ambulatory Visit (INDEPENDENT_AMBULATORY_CARE_PROVIDER_SITE_OTHER): Payer: 59 | Admitting: Family Medicine

## 2023-08-10 ENCOUNTER — Other Ambulatory Visit (HOSPITAL_COMMUNITY)
Admission: RE | Admit: 2023-08-10 | Discharge: 2023-08-10 | Disposition: A | Discharge status: Home / Self Care | Source: Ambulatory Visit | Attending: Family Medicine | Admitting: Family Medicine

## 2023-08-10 ENCOUNTER — Encounter: Payer: Self-pay | Admitting: Family Medicine

## 2023-08-10 VITALS — BP 130/86 | HR 69 | Resp 18 | Ht 65.0 in | Wt 142.1 lb

## 2023-08-10 DIAGNOSIS — Z0000 Encounter for general adult medical examination without abnormal findings: Secondary | ICD-10-CM

## 2023-08-10 DIAGNOSIS — D500 Iron deficiency anemia secondary to blood loss (chronic): Secondary | ICD-10-CM

## 2023-08-10 DIAGNOSIS — Z124 Encounter for screening for malignant neoplasm of cervix: Secondary | ICD-10-CM

## 2023-08-10 DIAGNOSIS — Z113 Encounter for screening for infections with a predominantly sexual mode of transmission: Secondary | ICD-10-CM | POA: Diagnosis not present

## 2023-08-10 NOTE — Assessment & Plan Note (Signed)
 Annual exam as documented. . Immunization and cancer screening needs are specifically addressed at this visit.

## 2023-08-10 NOTE — Progress Notes (Signed)
    Elizabeth Gardner     MRN: [id number]      DOB: 17-Aug-1985  Chief Complaint  Patient presents with   Annual Exam    Cpe w pap    HPI: Patient is in for annual physical exam. STD screening is requested Immunization is reviewed , and  updated if needed.   PE: BP 130/86   Pulse 69   Resp 18   Ht 5\' 5"  (1.651 m)   Wt 142 lb 1.3 oz (64.4 kg)   SpO2 98%   BMI 23.64 kg/m   Pleasant  female, alert and oriented x 3, in no cardio-pulmonary distress. Afebrile. HEENT No facial trauma or asymetry. Sinuses non tender.  Extra occullar muscles intact.. External ears normal, . Neck: supple, no adenopathy,JVD or thyromegaly.No bruits.  Chest: Clear to ascultation bilaterally.No crackles or wheezes. Non tender to palpation  Breast: No asymetry,no masses or lumps. No tenderness. No nipple discharge or inversion. No axillary or supraclavicular adenopathy  Cardiovascular system; Heart sounds normal,  S1 and  S2 ,no S3.  No murmur, or thrill. Apical beat not displaced Peripheral pulses normal.  Abdomen: Soft, non tender, no organomegaly or masses. No bruits. Bowel sounds normal. No guarding, tenderness or rebound.   GU: External genitalia normal female genitalia , normal female distribution of hair. No lesions. Urethral meatus normal in size, no  Prolapse, no lesions visibly  Present. Bladder non tender. Vagina pink and moist , with no visible lesions , discharge present . Adequate pelvic support no  cystocele or rectocele noted Cervix pink and appears healthy, no lesions or ulcerations noted, no discharge noted from os Uterus normal size, no adnexal masses, no cervical motion or adnexal tenderness.   Musculoskeletal exam: Full ROM of spine, hips , shoulders and knees. No deformity ,swelling or crepitus noted. No muscle wasting or atrophy.   Neurologic: Cranial nerves 2 to 12 intact. Power, tone ,sensation and reflexes normal throughout. No disturbance in gait. No  tremor.  Skin: Intact, no ulceration, erythema , scaling or rash noted. Pigmentation normal throughout  Psych; Normal mood and affect. Judgement and concentration normal   Assessment & Plan:  Encounter for annual physical exam Annual exam as documented.  Immunization and cancer screening needs are specifically addressed at this visit.

## 2023-08-10 NOTE — Assessment & Plan Note (Signed)
 Pap sent ?

## 2023-08-10 NOTE — Assessment & Plan Note (Signed)
 HIV, rPR, GC, Chlamydia and BV are tested

## 2023-08-10 NOTE — Patient Instructions (Addendum)
 Annual exam in 12 months, call if you need me sooner  Pls add iron, ferritin , hIV and RPR to lab order  Pap sent and all results will be sent via My Chart  All the best  It is important that you exercise regularly at least 30 minutes 5 times a week. If you develop chest pain, have severe difficulty breathing, or feel very tired, stop exercising immediately and seek medical attention    Thanks for choosing [facility], we consider it a privelige to serve you.

## 2023-08-11 ENCOUNTER — Other Ambulatory Visit: Payer: Self-pay

## 2023-08-11 DIAGNOSIS — Z13220 Encounter for screening for lipoid disorders: Secondary | ICD-10-CM | POA: Diagnosis not present

## 2023-08-11 DIAGNOSIS — Z202 Contact with and (suspected) exposure to infections with a predominantly sexual mode of transmission: Secondary | ICD-10-CM

## 2023-08-11 DIAGNOSIS — E559 Vitamin D deficiency, unspecified: Secondary | ICD-10-CM | POA: Diagnosis not present

## 2023-08-12 ENCOUNTER — Ambulatory Visit: Payer: Self-pay | Admitting: Family Medicine

## 2023-08-12 LAB — CBC
Hematocrit: 37.4 % (ref 34.0–46.6)
Hemoglobin: 11.3 g/dL (ref 11.1–15.9)
MCH: 23.4 pg — ABNORMAL LOW (ref 26.6–33.0)
MCHC: 30.2 g/dL — ABNORMAL LOW (ref 31.5–35.7)
MCV: 77 fL — ABNORMAL LOW (ref 79–97)
Platelets: 327 10*3/uL (ref 150–450)
RBC: 4.83 x10E6/uL (ref 3.77–5.28)
RDW: 15.5 % — ABNORMAL HIGH (ref 11.7–15.4)
WBC: 5.6 10*3/uL (ref 3.4–10.8)

## 2023-08-12 LAB — LIPID PANEL
Chol/HDL Ratio: 2.3 ratio (ref 0.0–4.4)
Cholesterol, Total: 132 mg/dL (ref 100–199)
HDL: 58 mg/dL (ref >39)
LDL Chol Calc (NIH): 65 mg/dL (ref 0–99)
Triglycerides: 34 mg/dL (ref 0–149)
VLDL Cholesterol Cal: 9 mg/dL (ref 5–40)

## 2023-08-12 LAB — CMP14+EGFR
ALT: 14 IU/L (ref 0–32)
AST: 16 IU/L (ref 0–40)
Albumin: 4.4 g/dL (ref 3.9–4.9)
Alkaline Phosphatase: 78 IU/L (ref 44–121)
BUN/Creatinine Ratio: 11 (ref 9–23)
BUN: 7 mg/dL (ref 6–20)
Bilirubin Total: 0.4 mg/dL (ref 0.0–1.2)
CO2: 20 mmol/L (ref 20–29)
Calcium: 9.4 mg/dL (ref 8.7–10.2)
Chloride: 105 mmol/L (ref 96–106)
Creatinine, Ser: 0.61 mg/dL (ref 0.57–1.00)
Globulin, Total: 2.5 g/dL (ref 1.5–4.5)
Glucose: 80 mg/dL (ref 70–99)
Potassium: 4.7 mmol/L (ref 3.5–5.2)
Sodium: 141 mmol/L (ref 134–144)
Total Protein: 6.9 g/dL (ref 6.0–8.5)
eGFR: 118 mL/min/{1.73_m2} (ref >59)

## 2023-08-12 LAB — TSH: TSH: 3.05 u[IU]/mL (ref 0.450–4.500)

## 2023-08-12 LAB — VITAMIN D 25 HYDROXY (VIT D DEFICIENCY, FRACTURES): Vit D, 25-Hydroxy: 25.1 ng/mL — ABNORMAL LOW (ref 30.0–100.0)

## 2023-08-13 DIAGNOSIS — D500 Iron deficiency anemia secondary to blood loss (chronic): Secondary | ICD-10-CM | POA: Diagnosis not present

## 2023-08-13 DIAGNOSIS — Z202 Contact with and (suspected) exposure to infections with a predominantly sexual mode of transmission: Secondary | ICD-10-CM | POA: Diagnosis not present

## 2023-08-13 DIAGNOSIS — Z113 Encounter for screening for infections with a predominantly sexual mode of transmission: Secondary | ICD-10-CM | POA: Diagnosis not present

## 2023-08-14 ENCOUNTER — Ambulatory Visit: Payer: Self-pay | Admitting: Family Medicine

## 2023-08-14 DIAGNOSIS — R87610 Atypical squamous cells of undetermined significance on cytologic smear of cervix (ASC-US): Secondary | ICD-10-CM

## 2023-08-14 LAB — RPR: RPR Ser Ql: NONREACTIVE

## 2023-08-14 LAB — IRON: Iron: 74 ug/dL (ref 27–159)

## 2023-08-14 LAB — HIV ANTIBODY (ROUTINE TESTING W REFLEX): HIV Screen 4th Generation wRfx: NONREACTIVE

## 2023-08-14 LAB — FERRITIN: Ferritin: 77 ng/mL (ref 15–150)

## 2023-08-17 LAB — CHLAMYDIA/GC NAA, CONFIRMATION
Chlamydia trachomatis, NAA: NEGATIVE
Neisseria gonorrhoeae, NAA: NEGATIVE

## 2023-08-18 ENCOUNTER — Ambulatory Visit: Payer: Self-pay | Admitting: Family Medicine

## 2023-08-18 LAB — CYTOLOGY - PAP
Adequacy: ABSENT
Comment: NEGATIVE
Diagnosis: UNDETERMINED — AB
High risk HPV: NEGATIVE

## 2023-08-18 LAB — NUSWAB VAGINITIS (VG): Trich vag by NAA: NEGATIVE

## 2023-10-18 ENCOUNTER — Encounter: Payer: Self-pay | Admitting: Family Medicine

## 2023-10-29 ENCOUNTER — Encounter: Payer: Self-pay | Admitting: Radiology

## 2023-11-14 ENCOUNTER — Encounter: Payer: Self-pay | Admitting: Family Medicine

## 2023-11-14 ENCOUNTER — Ambulatory Visit
Admission: EM | Admit: 2023-11-14 | Discharge: 2023-11-14 | Disposition: A | Discharge status: Home / Self Care | Attending: Nurse Practitioner | Admitting: Nurse Practitioner

## 2023-11-14 DIAGNOSIS — U071 COVID-19: Secondary | ICD-10-CM | POA: Diagnosis not present

## 2023-11-14 LAB — POC SOFIA SARS ANTIGEN FIA: SARS Coronavirus 2 Ag: POSITIVE — AB

## 2023-11-14 NOTE — ED Provider Notes (Signed)
 [facility]    CSN: [id number] Arrival date & time: 11/14/23  1405      History   Chief Complaint No chief complaint on file.   HPI Elizabeth Gardner is a 38 y.o. female.   Discussed the use of AI scribe software for clinical note transcription with the patient, who gave verbal consent to proceed.   The patient presents with complaints of sore throat, headache, and ear pain for the past 2 days. The patient reports body aches yesterday, which have since resolved. They have been taking over-the-counter Mucinex, which has provided some relief. The patient was also prescribed allergy medication by Dr. Antonetta, which they took last night and found it helped them sleep a little. The patient denies fever, chills, nasal congestion, runny nose, sneezing, cough, wheezing, shortness of breath, nausea, vomiting, or diarrhea. 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   The following portions of the patient's history were reviewed and updated as appropriate: allergies, current medications, past family history, past medical history, past social history, past surgical history, and problem list.    Past Medical History:  Diagnosis Date   General counseling for prescription of oral contraceptives    GERD (gastroesophageal reflux disease) 05/26/2016   Grief 02/12/2019   Other general counseling and advice for contraceptive management     Patient Active Problem List   Diagnosis Date Noted   Routine cervical smear 08/10/2023   Screening examination for venereal disease 08/10/2023   Insomnia 03/29/2023   Poor sleep hygiene 02/05/2022   Possible exposure to STD 12/31/2020   Encounter for annual physical exam 07/06/2020   Low back pain with left-sided sciatica 07/01/2020   Vitamin D  deficiency 10/17/2016   Pes planus 03/30/2016   Contraceptive management 04/22/2015   IDA (iron deficiency anemia) 12/03/2011   Allergic rhinitis 12/03/2011    Past Surgical  History:  Procedure Laterality Date   BACK SURGERY     for scoliosis     OB History   No obstetric history on file.      Home Medications    Prior to Admission medications   Medication Sig Start Date End Date Taking? Authorizing Provider  azelastine  (ASTELIN ) 0.1 % nasal spray Place 2 sprays into both nostrils 2 (two) times daily. Use in each nostril as directed 03/23/23   Antonetta Rollene BRAVO, MD  ferrous sulfate 325 (65 FE) MG tablet Take 325 mg by mouth daily with breakfast.    [provider]  hydrOXYzine  (ATARAX ) 10 MG tablet Take one tablet at bedtime for sleep and allergies 03/23/23   Antonetta Rollene BRAVO, MD  Norgestimate-Ethinyl Estradiol  Triphasic (ORTHO TRI-CYCLEN LO) 0.18/0.215/0.25 MG-25 MCG tab Take 1 tablet by mouth daily. Patient not taking: Reported on 08/10/2023 10/05/22   Antonetta Rollene BRAVO, MD  polyethylene glycol (MIRALAX ) 17 g packet Take 17 g by mouth daily. 08/05/21   Antonetta Rollene BRAVO, MD    Family History Family History  Problem Relation Age of Onset   Hypertension Mother     Social History Social History   Tobacco Use   Smoking status: Never   Smokeless tobacco: Never  Substance Use Topics   Alcohol use: No   Drug use: No     Allergies   Patient has no known allergies.   Review of Systems Review of Systems  Constitutional:  Negative for chills and fever.  HENT:  Positive for congestion, ear pain, rhinorrhea, sneezing and sore throat.  Respiratory:  Positive for cough. Negative for shortness of breath and wheezing.   Gastrointestinal:  Negative for diarrhea, nausea and vomiting.  Musculoskeletal:  Positive for myalgias.  Neurological:  Positive for headaches.  All other systems reviewed and are negative.    Physical Exam Triage Vital Signs ED Triage Vitals  Encounter Vitals Group     BP 11/14/23 1514 120/82     Girls Systolic BP Percentile --      Girls Diastolic BP Percentile --      Boys Systolic BP Percentile --       Boys Diastolic BP Percentile --      Pulse Rate 11/14/23 1514 98     Resp 11/14/23 1514 18     Temp 11/14/23 1514 (!) 97 F (36.1 C)     Temp Source 11/14/23 1514 Oral     SpO2 11/14/23 1514 97 %     Weight --      Height --      Head Circumference --      Peak Flow --      Pain Score 11/14/23 1517 0     Pain Loc --      Pain Education --      Exclude from Growth Chart --    No data found.  Updated Vital Signs BP 120/82 (BP Location: Right Arm)   Pulse 98   Temp (!) 97 F (36.1 C) (Oral)   Resp 18   LMP 10/22/2023   SpO2 97%   Visual Acuity Right Eye Distance:   Left Eye Distance:   Bilateral Distance:    Right Eye Near:   Left Eye Near:    Bilateral Near:     Physical Exam Vitals reviewed.  Constitutional:      General: She is awake. She is not in acute distress.    Appearance: Normal appearance. She is well-developed. She is not ill-appearing, toxic-appearing or diaphoretic.  HENT:     Head: Normocephalic.     Right Ear: Tympanic membrane, ear canal and external ear normal. No drainage, swelling or tenderness. No middle ear effusion. Tympanic membrane is not erythematous.     Left Ear: Tympanic membrane, ear canal and external ear normal. No drainage, swelling or tenderness.  No middle ear effusion. Tympanic membrane is not erythematous.     Nose: Congestion present. No rhinorrhea.     Mouth/Throat:     Lips: Pink.     Mouth: Mucous membranes are moist.     Pharynx: No pharyngeal swelling, oropharyngeal exudate, posterior oropharyngeal erythema or uvula swelling.     Tonsils: No tonsillar exudate or tonsillar abscesses.  Eyes:     General: Vision grossly intact.     Conjunctiva/sclera: Conjunctivae normal.  Cardiovascular:     Rate and Rhythm: Normal rate.     Heart sounds: Normal heart sounds.  Pulmonary:     Effort: Pulmonary effort is normal. No tachypnea or respiratory distress.     Breath sounds: Normal breath sounds and air entry.  Musculoskeletal:         General: Normal range of motion.     Cervical back: Normal range of motion and neck supple.  Lymphadenopathy:     Cervical: No cervical adenopathy.  Skin:    General: Skin is warm and dry.  Neurological:     General: No focal deficit present.     Mental Status: She is alert and oriented to person, place, and time.  Psychiatric:  Behavior: Behavior is cooperative.      UC Treatments / Results  Labs (all labs ordered are listed, but only abnormal results are displayed) Labs Reviewed  POC SOFIA SARS ANTIGEN FIA - Abnormal; Notable for the following components:      Result Value   SARS Coronavirus 2 Ag Positive (*)    All other components within normal limits    EKG   Radiology No results found.  Procedures Procedures (including critical care time)  Medications Ordered in UC Medications - No data to display  Initial Impression / Assessment and Plan / UC Course  I have reviewed the triage vital signs and the nursing notes.  Pertinent labs & imaging results that were available during my care of the patient were reviewed by me and considered in my medical decision making (see chart for details).     The patient presents with symptoms of an acute respiratory illness and tested positive for COVID-19. The patient should maintain hydration and get adequate rest. Education was provided regarding the current understanding of COVID-19, which has generally become less severe compared to earlier stages of the pandemic due to increased immunity and viral evolution. The patient was counseled on updated CDC guidance, which no longer requires a strict five-day isolation period. Instead, return to normal activities is appropriate once symptoms are improving overall and the patient has been fever-free for at least 24 hours without fever-reducing medications. Close monitoring of symptoms over the next several days was advised, with follow-up with primary care if symptoms are not  improving within a week. The patient should seek emergency care for shortness of breath, chest pain, dizziness, fainting, confusion, severe weakness, or inability to keep fluids down.  Today's evaluation has revealed no signs of a dangerous process. Discussed diagnosis with patient and/or guardian. Patient and/or guardian aware of their diagnosis, possible red flag symptoms to watch out for and need for close follow up. Patient and/or guardian understands verbal and written discharge instructions. Patient and/or guardian comfortable with plan and disposition.  Patient and/or guardian has a clear mental status at this time, good insight into illness (after discussion and teaching) and has clear judgment to make decisions regarding their care  Documentation was completed with the aid of voice recognition software. Transcription may contain typographical errors. Final Clinical Impressions(s) / UC Diagnoses   Final diagnoses:  COVID-19     Discharge Instructions      You have tested positive for COVID.  COVID is a viral infection.  Antibiotic medications are not prescribed for viral infections.  This is because antibiotics are designed to kill bacteria.  They do not kill viruses.  Take medications as prescribed.  Drink plenty of fluids and get lots of rest.  COVID-19 generally appears to be less severe now compared to earlier stages of the pandemic back in 2020. This is likely due to a combination of factors, including increased population immunity from vaccination and prior infections, as well as the evolution of the virus towards less virulent strains. While new variants continue to emerge, they generally cause milder illness, especially in individuals with prior immunity.  Because of this, the CDC has updated its isolation guidance for COVID-19 and states that a 5-day isolation period following a positive test result is no longer needed. Under the new guidelines, people will not need to isolate if they  are fever-free for at least 24 hours without medication and if their symptoms are mild and improving. You may return to normal activities  if your symptoms are overall improving and you have been without a fever for 24 hours without taking fever reducing medications like Tylenol  or ibuprofen . Monitor your symptoms closely over the next several days. If your symptoms are not improving within a week, follow up with your primary care provider.  Go to the emergency department right away if you develop shortness of breath, chest pain, dizziness, fainting, confusion, severe weakness, or if you are unable to keep fluids down, as these may be signs of more serious illness.     ED Prescriptions   None    PDMP not reviewed this encounter.   Iola Lukes, [place] 11/14/23 1623

## 2023-11-14 NOTE — ED Triage Notes (Signed)
 Pt reports sore throat, headache, ear pain x 2 days pt would like COVID testing.

## 2023-11-14 NOTE — Discharge Instructions (Addendum)
 You have tested positive for COVID.  COVID is a viral infection.  Antibiotic medications are not prescribed for viral infections.  This is because antibiotics are designed to kill bacteria.  They do not kill viruses.  Take medications as prescribed.  Drink plenty of fluids and get lots of rest.  COVID-19 generally appears to be less severe now compared to earlier stages of the pandemic back in 2020. This is likely due to a combination of factors, including increased population immunity from vaccination and prior infections, as well as the evolution of the virus towards less virulent strains. While new variants continue to emerge, they generally cause milder illness, especially in individuals with prior immunity.  Because of this, the CDC has updated its isolation guidance for COVID-19 and states that a 5-day isolation period following a positive test result is no longer needed. Under the new guidelines, people will not need to isolate if they are fever-free for at least 24 hours without medication and if their symptoms are mild and improving. You may return to normal activities if your symptoms are overall improving and you have been without a fever for 24 hours without taking fever reducing medications like Tylenol or ibuprofen. Monitor your symptoms closely over the next several days. If your symptoms are not improving within a week, follow up with your primary care provider.  Go to the emergency department right away if you develop shortness of breath, chest pain, dizziness, fainting, confusion, severe weakness, or if you are unable to keep fluids down, as these may be signs of more serious illness.

## 2023-11-15 ENCOUNTER — Ambulatory Visit: Admitting: Nurse Practitioner

## 2023-11-15 VITALS — BP 127/75 | HR 105 | Ht 65.0 in | Wt 145.0 lb

## 2023-11-15 DIAGNOSIS — U071 COVID-19: Secondary | ICD-10-CM | POA: Diagnosis not present

## 2023-11-15 DIAGNOSIS — H9202 Otalgia, left ear: Secondary | ICD-10-CM | POA: Diagnosis not present

## 2023-11-15 MED ORDER — NEOMYCIN-POLYMYXIN-HC 3.5-10000-1 OT SOLN: 4.0000 [drp] | Freq: Four times a day (QID) | OTIC | 0 refills | Status: AC

## 2023-11-15 MED ORDER — PAXLOVID (150/100) 10 X 150 MG & 10 X 100MG PO TBPK: 2.0000 | ORAL_TABLET | Freq: Two times a day (BID) | ORAL | 0 refills | Status: AC

## 2023-11-15 NOTE — Patient Instructions (Signed)
 1) COVID positive - pt's EGFR last was WNL so paxlovid , push fluids, rest, keep cough controlled 2) left otalgia - cortisporin otic 3) Follow up prn

## 2023-11-15 NOTE — Telephone Encounter (Signed)
 scheduled

## 2023-11-15 NOTE — Progress Notes (Signed)
 Established Patient Office Visit  Subjective:  Patient ID: Elizabeth Gardner, female    DOB: [birth year]  Age: 38 y.o. MRN: [id number]  Chief Complaint  Patient presents with   Follow-up    Urgent care follow up from covid    Patient here today for a positive COVID test at home over the weekend.  Headache and ST, minimal cough, denies SOB, fever, or chest congestion.  Went to Urgent Care.  Was told to treat her symptoms.  Patient inquiring for Paxlovid  as her GM has recently passed and family is over for the funeral and she is having left otalgia.    No other concerns at this time.   Past Medical History:  Diagnosis Date   General counseling for prescription of oral contraceptives    GERD (gastroesophageal reflux disease) 05/26/2016   Grief 02/12/2019   Other general counseling and advice for contraceptive management     Past Surgical History:  Procedure Laterality Date   BACK SURGERY     for scoliosis     Social History   Socioeconomic History   Marital status: Single    Spouse name: Not on file   Number of children: 1   Years of education: Not on file   Highest education level: 12th grade  Occupational History    Employer: [employer]  Tobacco Use   Smoking status: Never   Smokeless tobacco: Never  Substance and Sexual Activity   Alcohol use: No   Drug use: No   Sexual activity: Yes    Birth control/protection: Injection, None  Other Topics Concern   Not on file  Social History Narrative   Not on file   Social Drivers of Health   Financial Resource Strain: Low Risk  (11/15/2023)   Overall Financial Resource Strain (CARDIA)    Difficulty of Paying Living Expenses: Not very hard  Food Insecurity: No Food Insecurity (11/15/2023)   Hunger Vital Sign    Worried About Running Out of Food in the Last Year: Never true    Ran Out of Food in the Last Year: Never true  Transportation Needs: Unmet Transportation Needs (11/15/2023)   PRAPARE - Transportation    Lack of  Transportation (Medical): Yes    Lack of Transportation (Non-Medical): Yes  Physical Activity: Inactive (11/15/2023)   Exercise Vital Sign    Days of Exercise per Week: 0 days    Minutes of Exercise per Session: Not on file  Stress: No Stress Concern Present (11/15/2023)   [employer] of Occupational Health - Occupational Stress Questionnaire    Feeling of Stress: Not at all  Social Connections: Moderately Isolated (11/15/2023)   Social Connection and Isolation Panel    Frequency of Communication with Friends and Family: More than three times a week    Frequency of Social Gatherings with Friends and Family: Once a week    Attends Religious Services: 1 to 4 times per year    Active Member of [employer] or Organizations: No    Attends Engineer, structural: Not on file    Marital Status: Never married  Catering manager Violence: Not on file    Family History  Problem Relation Age of Onset   Hypertension Mother     No Known Allergies  Outpatient Medications Prior to Visit  Medication Sig   azelastine  (ASTELIN ) 0.1 % nasal spray Place 2 sprays into both nostrils 2 (two) times daily. Use in each nostril as directed   ferrous sulfate 325 (65 FE)  MG tablet Take 325 mg by mouth daily with breakfast.   hydrOXYzine  (ATARAX ) 10 MG tablet Take one tablet at bedtime for sleep and allergies   Norgestimate-Ethinyl Estradiol  Triphasic (ORTHO TRI-CYCLEN LO) 0.18/0.215/0.25 MG-25 MCG tab Take 1 tablet by mouth daily. (Patient not taking: Reported on 08/10/2023)   polyethylene glycol (MIRALAX ) 17 g packet Take 17 g by mouth daily.   No facility-administered medications prior to visit.    ROS     Objective:   BP 127/75   Pulse (!) 105   Ht 5' 5 (1.651 m)   Wt 145 lb (65.8 kg)   LMP 10/22/2023   SpO2 98%   BMI 24.13 kg/m   Vitals:   11/15/23 1036  BP: 127/75  Pulse: (!) 105  Height: 5' 5 (1.651 m)  Weight: 145 lb (65.8 kg)  SpO2: 98%  BMI (Calculated): 24.13    Physical  Exam Vitals and nursing note reviewed.  Constitutional:      Appearance: Normal appearance.  HENT:     Head: Normocephalic.     Right Ear: Tympanic membrane, ear canal and external ear normal.     Left Ear: Tympanic membrane normal.     Ears:     Comments: Left EAC erythema    Nose: Congestion present.     Mouth/Throat:     Mouth: Mucous membranes are moist.  Cardiovascular:     Rate and Rhythm: Normal rate and regular rhythm.     Pulses: Normal pulses.     Heart sounds: Normal heart sounds.  Pulmonary:     Effort: Pulmonary effort is normal.     Breath sounds: Normal breath sounds.  Musculoskeletal:        General: Normal range of motion.     Cervical back: Normal range of motion and neck supple.  Skin:    General: Skin is warm and dry.  Neurological:     Mental Status: She is alert and oriented to person, place, and time.  Psychiatric:        Mood and Affect: Mood normal.        Behavior: Behavior normal.      No results found for any visits on 11/15/23.  Recent Results (from the past 2160 hours)  POC SARS Coronavirus 2 Ag     Status: Abnormal   Collection Time: 11/14/23  3:21 PM  Result Value Ref Range   SARS Coronavirus 2 Ag Positive (A) Negative      Assessment & Plan: 1) COVID positive - pt's EGFR last was WNL so paxlovid , push fluids, rest, keep cough controlled 2) left otalgia - cortisporin otic 3) Follow up prn   Problem List Items Addressed This Visit       Other   Otalgia of left ear   Relevant Medications   neomycin -polymyxin-hydrocortisone (CORTISPORIN) OTIC solution   COVID - Primary   Relevant Medications   nirmatrelvir/ritonavir, renal dosing, (PAXLOVID , 150/100,) 10 x 150 MG & 10 x 100MG  TBPK    No follow-ups on file.   Total time spent: 15 minutes  Neale Carpen, NP  11/15/2023   This document may have been prepared by [facility] Voice Recognition software and as such may include unintentional dictation errors.

## 2023-12-02 ENCOUNTER — Encounter: Payer: Self-pay | Admitting: Family Medicine

## 2023-12-07 ENCOUNTER — Encounter: Payer: Self-pay | Admitting: Family Medicine

## 2023-12-07 ENCOUNTER — Ambulatory Visit: Admitting: Family Medicine

## 2023-12-07 VITALS — BP 118/72 | HR 81 | Temp 98.4°F | Ht 65.0 in | Wt 147.0 lb

## 2023-12-07 DIAGNOSIS — R300 Dysuria: Secondary | ICD-10-CM | POA: Diagnosis not present

## 2023-12-07 DIAGNOSIS — Z202 Contact with and (suspected) exposure to infections with a predominantly sexual mode of transmission: Secondary | ICD-10-CM

## 2023-12-07 LAB — URINALYSIS, ROUTINE W REFLEX MICROSCOPIC
Bilirubin Urine: NEGATIVE
Glucose, UA: NEGATIVE
Hgb urine dipstick: NEGATIVE
Ketones, ur: NEGATIVE
Leukocytes,Ua: NEGATIVE
Nitrite: NEGATIVE
Protein, ur: NEGATIVE
Specific Gravity, Urine: 1.015 (ref 1.001–1.035)
pH: 7 (ref 5.0–8.0)

## 2023-12-07 LAB — WET PREP FOR TRICH, YEAST, CLUE

## 2023-12-07 MED ORDER — METRONIDAZOLE 500 MG PO TABS: 500.0000 mg | ORAL_TABLET | Freq: Two times a day (BID) | ORAL | 0 refills | Status: AC

## 2023-12-07 NOTE — Progress Notes (Signed)
   Subjective:    Patient ID: Elizabeth [employer], female    DOB: 25-Jul-1985, 38 y.o.   MRN: [id number]  Dysuria    Patient reports mild dysuria.  She reports pelvic irritation.  She reports itching and burning.  She reports frequency.  Urinalysis however is completely normal today.  Leukocyte esterase is negative.  Nitrates are negative.  Blood is negative.  Patient does report some vaginal discharge. Past Medical History:  Diagnosis Date   General counseling for prescription of oral contraceptives    GERD (gastroesophageal reflux disease) 05/26/2016   Grief 02/12/2019   Other general counseling and advice for contraceptive management    Past Surgical History:  Procedure Laterality Date   BACK SURGERY     for scoliosis    Current Outpatient Medications on File Prior to Visit  Medication Sig Dispense Refill   azelastine  (ASTELIN ) 0.1 % nasal spray Place 2 sprays into both nostrils 2 (two) times daily. Use in each nostril as directed 30 mL 12   ferrous sulfate 325 (65 FE) MG tablet Take 325 mg by mouth daily with breakfast.     hydrOXYzine  (ATARAX ) 10 MG tablet Take one tablet at bedtime for sleep and allergies 30 tablet 5   neomycin -polymyxin-hydrocortisone (CORTISPORIN) OTIC solution Place 4 drops into the left ear 4 (four) times daily. 10 mL 0   Norgestimate-Ethinyl Estradiol  Triphasic (ORTHO TRI-CYCLEN LO) 0.18/0.215/0.25 MG-25 MCG tab Take 1 tablet by mouth daily. (Patient not taking: Reported on 08/10/2023) 28 tablet 11   polyethylene glycol (MIRALAX ) 17 g packet Take 17 g by mouth daily. 100 each 2   No current facility-administered medications on file prior to visit.   No Known Allergies    Review of Systems  Genitourinary:  Positive for dysuria.  All other systems reviewed and are negative.      Objective:   Physical Exam Vitals reviewed.  Constitutional:      Appearance: Normal appearance. She is normal weight.  Cardiovascular:     Rate and Rhythm: Normal rate and  regular rhythm.     Heart sounds: Normal heart sounds.  Pulmonary:     Effort: Pulmonary effort is normal.     Breath sounds: Normal breath sounds.  Abdominal:     General: Bowel sounds are normal.     Palpations: Abdomen is soft.  Genitourinary:    General: Normal vulva.     Vagina: Vaginal discharge present.  Neurological:     Mental Status: She is alert.           Assessment & Plan:   Dysuria - Plan: Urinalysis, Routine w reflex microscopic, WET PREP FOR TRICH, YEAST, CLUE, GC/CT Probe, Amp (Throat) Urinalysis shows no evidence of urinary tract infection.  I did send a probe for gonorrhea and chlamydia which is pending.  Wet prep did show many clue cells.  Treat with Flagyl  500 mg twice daily for 7 days

## 2023-12-07 NOTE — Addendum Note (Signed)
 Addended by: ANGELENA RONAL SLATER MARLA on: 12/07/2023 09:36 AM   Modules accepted: Orders

## 2023-12-08 LAB — C. TRACHOMATIS/N. GONORRHOEAE RNA
C. trachomatis RNA, TMA: NOT DETECTED
N. gonorrhoeae RNA, TMA: NOT DETECTED

## 2023-12-09 ENCOUNTER — Ambulatory Visit: Payer: Self-pay | Admitting: Family Medicine

## 2024-01-10 ENCOUNTER — Encounter: Payer: Self-pay | Admitting: Radiology

## 2024-08-10 ENCOUNTER — Encounter: Admitting: Family Medicine
